# Patient Record
Sex: Female | Born: 1995 | Race: Black or African American | Hispanic: No | Marital: Single | Smoking: Never smoker
Health system: Southern US, Community
[De-identification: ages and names within clinical notes are randomized; demographics above are authoritative.]

---

## 2018-07-13 ENCOUNTER — Encounter (HOSPITAL_BASED_OUTPATIENT_CLINIC_OR_DEPARTMENT_OTHER): Payer: Self-pay | Admitting: Obstetrics & Gynecology

## 2018-07-13 ENCOUNTER — Ambulatory Visit: Payer: Self-pay

## 2018-07-13 ENCOUNTER — Ambulatory Visit (INDEPENDENT_AMBULATORY_CARE_PROVIDER_SITE_OTHER): Payer: Medicaid Other | Admitting: Obstetrics & Gynecology

## 2018-07-13 VITALS — BP 113/74 | HR 92 | Ht 65.0 in | Wt 247.0 lb

## 2018-07-13 DIAGNOSIS — Z01419 Encounter for gynecological examination (general) (routine) without abnormal findings: Secondary | ICD-10-CM

## 2018-07-13 DIAGNOSIS — Z113 Encounter for screening for infections with a predominantly sexual mode of transmission: Secondary | ICD-10-CM

## 2018-07-13 DIAGNOSIS — Z7189 Other specified counseling: Secondary | ICD-10-CM

## 2018-07-13 DIAGNOSIS — Z23 Encounter for immunization: Secondary | ICD-10-CM

## 2018-07-13 DIAGNOSIS — N898 Other specified noninflammatory disorders of vagina: Secondary | ICD-10-CM

## 2018-07-13 DIAGNOSIS — Z124 Encounter for screening for malignant neoplasm of cervix: Secondary | ICD-10-CM

## 2018-07-13 DIAGNOSIS — Z7185 Encounter for immunization safety counseling: Secondary | ICD-10-CM

## 2018-07-13 LAB — HEMOLYSIS INDEX: Hemolysis Index: 7 (ref 0–18)

## 2018-07-13 LAB — SYPHILIS SCREEN IGG AND IGM: Syphilis Screen IgG and IgM: NONREACTIVE

## 2018-07-13 LAB — HIV AG/AB 4TH GENERATION: HIV Ag/Ab, 4th Generation: NONREACTIVE

## 2018-07-13 LAB — THIN PREP PAP W/IMAGE ANALYSIS ,REFLEX HPV

## 2018-07-13 LAB — HEPATITIS B SURFACE ANTIGEN W/ REFLEX TO CONFIRMATION: Hepatitis B Surface Antigen: NONREACTIVE

## 2018-07-13 LAB — HEPATITIS C ANTIBODY: Hepatitis C, AB: NONREACTIVE

## 2018-07-13 MED ORDER — HPV 9-VALENT RECOMB VACCINE IM SUSY
1.00 | PREFILLED_SYRINGE | Freq: Once | INTRAMUSCULAR | Status: AC
Start: 2018-07-13 — End: 2018-07-13
  Administered 2018-07-13: 12:00:00 0.5 mL via INTRAMUSCULAR

## 2018-07-13 NOTE — Progress Notes (Signed)
HPI:  Tara Carroll is a 22 y.o. female, G1P1001, Patient's last menstrual period was 06/13/2018 (approximate).. Patient (new) is here today for annual GYN visit.    Patient denies any abnormal pain, abnormal vaginal bleeding, itching/burning.  She reports: no other GYN complaints except vaginal discharge.     Gyn Hx:  Last PAP:wnl but unsure when per pt / Hx abnormal pap: No    Menses: 13 / 2-7 days / monthly  BCM(current): none  BCM(prior): none   Sexual Activity: Yes(with man) / Condom use: No / Abuse: No    Hx STDs: No    HPV Vaccine: Not completed (1st dose 07/13/18)  OB Hx: G1P1001 (C/S x1 for 2nd stage arrest)    PMH:  History reviewed. No pertinent past medical history.  PSH:  Past Surgical History:   Procedure Laterality Date   . CESAREAN SECTION  2017     SH:  Social History     Socioeconomic History   . Marital status: Unknown     Spouse name: Not on file   . Number of children: Not on file   . Years of education: Not on file   . Highest education level: Not on file   Occupational History   . Not on file   Social Needs   . Financial resource strain: Not on file   . Food insecurity:     Worry: Not on file     Inability: Not on file   . Transportation needs:     Medical: Not on file     Non-medical: Not on file   Tobacco Use   . Smoking status: Never Smoker   . Smokeless tobacco: Never Used   Substance and Sexual Activity   . Alcohol use: Yes   . Drug use: Never   . Sexual activity: Yes     Partners: Male     Birth control/protection: None   Lifestyle   . Physical activity:     Days per week: Not on file     Minutes per session: Not on file   . Stress: Not on file   Relationships   . Social connections:     Talks on phone: Not on file     Gets together: Not on file     Attends religious service: Not on file     Active member of club or organization: Not on file     Attends meetings of clubs or organizations: Not on file     Relationship status: Not on file   . Intimate partner violence:     Fear of current  or ex partner: Not on file     Emotionally abused: Not on file     Physically abused: Not on file     Forced sexual activity: Not on file   Other Topics Concern   . Not on file   Social History Narrative   . Not on file     FH:  Family History   Problem Relation Age of Onset   . Hypertension Maternal Grandmother    . Hypertension Mother    . Miscarriages / [place] Mother    . Diabetes Other    . Breast cancer Neg Hx    . Cancer Neg Hx    . Colon cancer Neg Hx    . Eclampsia Neg Hx    . Ovarian cancer Neg Hx    . Stroke Neg Hx    . Preterm labor Neg Hx  No Known Allergies  No current outpatient medications on file.     No current facility-administered medications for this visit.      Review of Systems:  General ROS: negative for fatigue, fever/chills, weight loss  Breast ROS: negative for breast lumps, nipple discharge  Gastrointestinal ROS: no abdominal pain, change in bowel habits  Genitourinary ROS: no dysuria, trouble voiding, or hematuria  Skin: denies rash or lesion  Psych: denies depression/anxiety  All other systems as per HPI, otherwise reviewed and negative     Objective:  BP 113/74   Pulse 92   Ht 5\' 5"  (1.651 m)   Wt 247 lb (112 kg)   LMP 06/13/2018 (Approximate)   BMI 41.10 kg/m     General appearance: alert & oriented x3, well appearing, NAD  Breasts: breasts appear normal, symmetrical, no suspicious masses, no skin or nipple changes or axillary nodes, no nipple discharge  Abdomen - soft, nontender, nondistended, no masses  Extremities: symmetric, no signs of clubbing or edema     Pelvic exam:  VULVA: normal appearing vulva with no masses, tenderness or lesions, normal clitoris,  VAGINA: normal appearing vagina with normal color/secretions, no abnormal discharge, no lesions,  CERVIX: normal appearing cervix without discharge or lesions,  UTERUS: uterus is normal size, shape, consistency and non-tender  ADNEXA:  Non-tender and no masses.    Assessment: 22 y.o. G18P1001 female.  1. Well woman  exam with routine gynecological exam    2. Screening for STDs (sexually transmitted diseases)    3. Encounter for screening for cervical cancer     4. Vaginal discharge      Plan:  - Well woman exam completed: PAP sent today b/c pt is NEW w/o records.  - Self breast exam reviewed, self-breast awareness encouraged  - Contraception counseling completed: LARC recommended  - STI testing offered, ordered: Yes pt desires. Safe sexual practices reviewed, condom use recommended.  - HPV vaccine discussed and recommended: 1st shot given today  - Recommend healthy diet, activity/excercise and habits.  - All questions answered and POC/AVS reviewed with the patient.    - RTO: in 1 year for annual well woman GYN/breast exam and PRN.    - GYN issue(s): vaginal discharge - r/o vaginitis    Orders Placed This Encounter   Procedures   . SureSwab(TM) Plus   . HIV Ag/Ab 4th generation   . Hepatitis C (HCV) antibody, Total   . Syphilis Screen IgG and IgM   . Hepatitis B surface antigen     Idelle Leech MD, Evern Core

## 2018-07-13 NOTE — Patient Instructions (Signed)
You had a normal well woman exam including a normal breast exam today.   Please continue to do your monthly self breast exam.  Continue to eat a diet with many fruits, vegetables and lean proteins.   It is best to aim for at least 30-60 minutes of exercise daily.   Please consume a diet rich in calcium and vitamin D. The recommended intake for women ages 18-50 for calcium is at least 1000 mg daily and for vitamin D is 600 international units daily.   Please use condoms with every intercourse to prevent sexually transmitted infections.   You should follow up in 1 year for a well woman exam.     Screening:   Pap testing starts at age 21 and is recommended every 3 years until age 30. After age 30 pap testing is recommended every 5 years.   Mammogram screening should start at age 40 sooner if you have a strong family history of breast cancer.

## 2018-07-13 NOTE — Progress Notes (Signed)
Medication Name: Gardasil 9  Site: LD  After obtaining consent and orders per Dr. Mayford Knife, injection of Gardasil 9 given by Audery Amel, LPN. Patient states there are No Known Allergies. Patient instructed to remain in clinic for 15 minutes afterwards, and report any adverse reaction to me immediately. Patient left in good condition.  Completed by: KDB  Verified by Audery Amel, LPN

## 2018-07-16 LAB — SURESWAB(TM) PLUS
Atopobium vaginae: NOT DETECTED
Candida Glabrata,DNA: NOT DETECTED
Candida Parapsilosis,DNA: NOT DETECTED
Candida Tropicalis,DNA: NOT DETECTED
Candida albicans DNA: NOT DETECTED
Chlamydia trachomatis RNA, TMA: NOT DETECTED
Gardnerella vaginalis: NOT DETECTED
Lactobacillus species: >8
Megasphaera species: NOT DETECTED
N. gonorrhoeae RNA, TMA: NOT DETECTED
Trichomonas Vaginalis RNA, QL TMA: NOT DETECTED

## 2018-07-18 ENCOUNTER — Encounter (HOSPITAL_BASED_OUTPATIENT_CLINIC_OR_DEPARTMENT_OTHER): Payer: Self-pay | Admitting: Obstetrics & Gynecology

## 2018-09-13 ENCOUNTER — Ambulatory Visit (HOSPITAL_BASED_OUTPATIENT_CLINIC_OR_DEPARTMENT_OTHER): Payer: Medicaid Other

## 2019-11-21 ENCOUNTER — Ambulatory Visit (HOSPITAL_BASED_OUTPATIENT_CLINIC_OR_DEPARTMENT_OTHER): Payer: Medicaid Other | Admitting: Nurse Practitioner

## 2020-01-25 ENCOUNTER — Ambulatory Visit (INDEPENDENT_AMBULATORY_CARE_PROVIDER_SITE_OTHER): Payer: Medicaid Other | Admitting: Obstetrics & Gynecology

## 2020-01-25 ENCOUNTER — Encounter (HOSPITAL_BASED_OUTPATIENT_CLINIC_OR_DEPARTMENT_OTHER): Payer: Self-pay | Admitting: Obstetrics & Gynecology

## 2020-01-25 VITALS — BP 119/79 | HR 79 | Temp 98.7°F | Ht 65.0 in | Wt 271.8 lb

## 2020-01-25 DIAGNOSIS — N926 Irregular menstruation, unspecified: Secondary | ICD-10-CM

## 2020-02-29 ENCOUNTER — Encounter (HOSPITAL_BASED_OUTPATIENT_CLINIC_OR_DEPARTMENT_OTHER): Payer: Self-pay | Admitting: Obstetrics & Gynecology

## 2020-02-29 NOTE — Progress Notes (Signed)
HPI:   Tara Carroll is a 24 y.o. female, G1P1001, Patient's last menstrual period was 01/14/2020 (exact date).. Established patient presents today for GYN problem: irregular menses. Patient reports: absence of menses x2 months and thinking she was pregnant but home UPT neg. Since then she has noticed an irregularity to timing of menses.     Menstrual Diary (past 5 months):  Jan: 16-20  Feb: missed  March: missed  April: 1-5  May: 1-3    Brief GYN Hx:  Last PAP:06/2018 - negative  Hx abnormal pap: No   Menses: 13 / 3-5 days / monthly   BCM(current): none   BCM(prior): none   Sexual Activity: Yes(with man) / Condom use: No / Abuse: No   Hx STDs: No   HPV Vaccine: Not completed (1st dose 07/13/18)   OB Hx: G1P1001 (C/S x1 for 2nd stage arrest)    No Known Allergies  No current outpatient medications on file.    Review of Systems:  All other systems reviewed as in HPI or o/w negative    Objective:  BP 119/79 (BP Site: Left arm, Patient Position: Sitting, Cuff Size: Large)    Pulse 79    Temp 98.7 F (37.1 C)    Ht 5\' 5"  (1.651 m)    Wt 271 lb 12.8 oz (123.3 kg)    LMP 01/14/2020 (Exact Date)    BMI 45.23 kg/m     General appearance - A&O x3, well-appearing, NAD    Assessment:  1. Irregular menses       Plan:  - reviewed normal ranges for menstrual cycles and variations that can occur. Typically most common cause for skipped menses is stress-related once pregnancy ruled out. Her most recent pattern since the skipped months is WNL. Reassurance given.   - Advised menstrual diary and to notify this MD for any AUB, prolonged/heavy VB or impending amenorrhea >4 months w/o menses  - no imaging recommended  - can regulate menses w/ hormonal BCM (OCPs, patch, NuvaRing); declined  - Follow-up: prn and annual GYN  - All questions answered and POC/AVS reviewed with the patient.    Idelle Leech MD, [facility]

## 2020-03-29 ENCOUNTER — Encounter (HOSPITAL_BASED_OUTPATIENT_CLINIC_OR_DEPARTMENT_OTHER): Payer: Self-pay | Admitting: Emergency Medicine

## 2020-03-29 ENCOUNTER — Other Ambulatory Visit: Payer: Self-pay

## 2020-03-29 DIAGNOSIS — O200 Threatened abortion: Secondary | ICD-10-CM | POA: Insufficient documentation

## 2020-03-29 DIAGNOSIS — R1030 Lower abdominal pain, unspecified: Secondary | ICD-10-CM | POA: Diagnosis present

## 2020-03-29 NOTE — ED Triage Notes (Signed)
Lower abdominal pain and spotting x3 weeks.

## 2020-03-30 ENCOUNTER — Other Ambulatory Visit (HOSPITAL_BASED_OUTPATIENT_CLINIC_OR_DEPARTMENT_OTHER): Payer: Self-pay | Admitting: Emergency Medicine

## 2020-03-30 ENCOUNTER — Encounter (HOSPITAL_BASED_OUTPATIENT_CLINIC_OR_DEPARTMENT_OTHER): Payer: Self-pay | Admitting: Emergency Medicine

## 2020-03-30 ENCOUNTER — Ambulatory Visit (HOSPITAL_BASED_OUTPATIENT_CLINIC_OR_DEPARTMENT_OTHER)
Admission: RE | Admit: 2020-03-30 | Discharge: 2020-03-30 | Disposition: A | Discharge status: Home / Self Care | Payer: Medicaid Other | Source: Ambulatory Visit | Attending: Emergency Medicine | Admitting: Emergency Medicine

## 2020-03-30 ENCOUNTER — Emergency Department (HOSPITAL_BASED_OUTPATIENT_CLINIC_OR_DEPARTMENT_OTHER)
Admission: EM | Admit: 2020-03-30 | Discharge: 2020-03-30 | Disposition: A | Discharge status: Home / Self Care | Payer: Medicaid Other | Attending: Emergency Medicine | Admitting: Emergency Medicine

## 2020-03-30 DIAGNOSIS — R102 Pelvic and perineal pain: Secondary | ICD-10-CM | POA: Diagnosis present

## 2020-03-30 DIAGNOSIS — O200 Threatened abortion: Secondary | ICD-10-CM

## 2020-03-30 LAB — URINALYSIS, MICROSCOPIC (REFLEX)

## 2020-03-30 LAB — URINALYSIS, ROUTINE W REFLEX MICROSCOPIC
Bilirubin Urine: NEGATIVE
Glucose, UA: NEGATIVE mg/dL
Ketones, ur: NEGATIVE mg/dL
Leukocytes,Ua: NEGATIVE
Nitrite: NEGATIVE
Protein, ur: NEGATIVE mg/dL
Specific Gravity, Urine: >1.03 — ABNORMAL HIGH (ref 1.005–1.030)
pH: 6 (ref 5.0–8.0)

## 2020-03-30 LAB — ABO/RH: ABO/RH(D): O POS

## 2020-03-30 LAB — WET PREP, GENITAL
Clue Cells Wet Prep HPF POC: NONE SEEN
Sperm: NONE SEEN
Trich, Wet Prep: NONE SEEN
Yeast Wet Prep HPF POC: NONE SEEN

## 2020-03-30 LAB — HCG, QUANTITATIVE, PREGNANCY: hCG, Beta Chain, Quant, S: 207 m[IU]/mL — ABNORMAL HIGH (ref <5)

## 2020-03-30 LAB — PREGNANCY, URINE: Preg Test, Ur: POSITIVE — AB

## 2020-03-30 NOTE — ED Provider Notes (Signed)
[facility] Provider Note: Lowella Dell, MD, FACEP  CSN: [id number] MRN: [id number] ARRIVAL: 03/29/20 at 2332 ROOM: [facility]11/[facility]11   CHIEF COMPLAINT  Abdominal Pain   HISTORY OF PRESENT ILLNESS  03/30/20 2:32 AM Margaret Erickson is a 24 y.o. female who has had lower abdominal pain for the past 3 weeks.  She has had vaginal spotting with this as well.  Yesterday evening she developed severe lower abdominal pain which she rated as a 10 out of 10.  While taking a shower she passed what appeared to be a lump of clot or tissue.  She had never passed anything like that in her experience.  Her pain is subsequently significantly improved.  She denies nausea or vomiting currently.  She was not aware of being pregnant but her pregnancy test is positive.   History reviewed. No pertinent past medical history.  Past Surgical History:  Procedure Laterality Date  . CESAREAN SECTION      No family history on file.  Social History   Tobacco Use  . Smoking status: Never Smoker  . Smokeless tobacco: Never Used  Substance Use Topics  . Alcohol use: Not Currently  . Drug use: Not Currently    Prior to Admission medications   Not on File    Allergies Patient has no known allergies.   REVIEW OF SYSTEMS  Negative except as noted here or in the History of Present Illness.   PHYSICAL EXAMINATION  Initial Vital Signs Blood pressure 120/65, pulse 88, temperature 98.4 F (36.9 C), temperature source Oral, resp. rate 14, weight 120.1 kg, last menstrual period 03/08/2020, SpO2 100 %.  Examination General: Well-developed, well-nourished female in no acute distress; appearance consistent with age of record HENT: normocephalic; atraumatic Eyes: pupils equal, round and reactive to light; extraocular muscles intact Neck: supple Heart: regular rate and rhythm Lungs: clear to auscultation bilaterally Abdomen: soft; nondistended; nontender; bowel sounds present GU: Normal external  genitalia; cervical motion tenderness; whitish/mucoid vaginal discharge; no vaginal bleeding; cervical motion tenderness; no adnexal tenderness Extremities: No deformity; full range of motion; pulses normal Neurologic: Awake, alert and oriented; motor function intact in all extremities and symmetric; no facial droop Skin: Warm and dry Psychiatric: Normal mood and affect   RESULTS  Summary of this visit's results, reviewed and interpreted by myself:   EKG Interpretation  Date/Time:    Ventricular Rate:    PR Interval:    QRS Duration:   QT Interval:    QTC Calculation:   R Axis:     Text Interpretation:        Laboratory Studies: Results for orders placed or performed during the hospital encounter of 03/30/20 (from the past 24 hour(s))  Pregnancy, urine     Status: Abnormal   Collection Time: 03/29/20 11:45 PM  Result Value Ref Range   Preg Test, Ur POSITIVE (A) NEGATIVE  Urinalysis, Routine w reflex microscopic     Status: Abnormal   Collection Time: 03/29/20 11:45 PM  Result Value Ref Range   Color, Urine YELLOW YELLOW   APPearance HAZY (A) CLEAR   Specific Gravity, Urine >1.030 (H) 1.005 - 1.030   pH 6.0 5.0 - 8.0   Glucose, UA NEGATIVE NEGATIVE mg/dL   Hgb urine dipstick TRACE (A) NEGATIVE   Bilirubin Urine NEGATIVE NEGATIVE   Ketones, ur NEGATIVE NEGATIVE mg/dL   Protein, ur NEGATIVE NEGATIVE mg/dL   Nitrite NEGATIVE NEGATIVE   Leukocytes,Ua NEGATIVE NEGATIVE  Urinalysis, Microscopic (reflex)     Status: Abnormal  Collection Time: 03/29/20 11:45 PM  Result Value Ref Range   RBC / HPF 0-5 0 - 5 RBC/hpf   WBC, UA 0-5 0 - 5 WBC/hpf   Bacteria, UA MANY (A) NONE SEEN   Squamous Epithelial / LPF 6-10 0 - 5   Mucus PRESENT   Wet prep, genital     Status: Abnormal   Collection Time: 03/30/20  2:44 AM  Result Value Ref Range   Yeast Wet Prep HPF POC NONE SEEN NONE SEEN   Trich, Wet Prep NONE SEEN NONE SEEN   Clue Cells Wet Prep HPF POC NONE SEEN NONE SEEN   WBC,  Wet Prep HPF POC MANY (A) NONE SEEN   Sperm NONE SEEN    Imaging Studies: No results found.  ED COURSE and MDM  Nursing notes, initial and subsequent vitals signs, including pulse oximetry, reviewed and interpreted by myself.  Vitals:   03/29/20 2338 03/29/20 2343 03/30/20 0222  BP:  134/75 120/65  Pulse:  95 88  Resp:  18 14  Temp:  99.1 F (37.3 C) 98.4 F (36.9 C)  TempSrc:   Oral  SpO2:  100% 100%  Weight: 120.1 kg     Medications - No data to display  I suspect the patient has miscarried given that she has passed what appeared to be tissue and her pain has subsequently improved.  We will have her return later today for a transvaginal ultrasound.  PROCEDURES  Procedures   ED DIAGNOSES     ICD-10-CM   1. Threatened miscarriage in early pregnancy  O20.0        Trishelle Devora, MD 03/30/20 [phone]

## 2020-03-30 NOTE — ED Provider Notes (Signed)
Patient returned for [place] follow up and results.  [place] was obtained.   US OB LESS THAN 14 WEEKS WITH OB TRANSVAGINAL  Result Date: 03/30/2020 CLINICAL DATA:  Pelvic pain and vaginal bleeding. Positive pregnancy test. EXAM: OBSTETRIC <14 WK [place] AND TRANSVAGINAL OB US TECHNIQUE: Both transabdominal and transvaginal ultrasound examinations were performed for complete evaluation of the gestation as well as the maternal uterus, adnexal regions, and pelvic cul-de-sac. Transvaginal technique was performed to assess early pregnancy. COMPARISON:  None. FINDINGS: Intrauterine gestational sac: None Maternal uterus/adnexae: Endometrial thickness measures 11 mm. No fibroids identified. Both ovaries are normal in appearance. No adnexal mass or abnormal free fluid identified. IMPRESSION: Pregnancy of unknown anatomic location (no intrauterine gestational sac or adnexal mass identified). Differential diagnosis includes recent spontaneous abortion, IUP too early to visualize, and non-visualized ectopic pregnancy. Recommend correlation with serial beta-hCG levels, and follow up US if warranted clinically. Electronically Signed   By: Danae Orleans M.D.   On: 03/30/2020 11:58   Labs show her HCG was elevated at 207.   Patient reports overall she is doing better.  We discussed her results including possibility of pregnancy of unknown anatomic location, versus IUP too early to visualize versus miscarriage.  Recommended close outpatient follow-up for repeat hCG testing in approximately 48 hours.  She is given the information for [facility] outpatient clinic, instructed to call today to try and set up an appointment for Monday.  Definitely she is given the information for [facility], with instructions to proceed there if she has any new or concerning or worsening symptoms.  She states her understanding, denies any additional needs or questions at this time.  She ambulated in no distress.    Cristina Gong, PA-C 03/30/20 1231    Sabas Sous, MD 03/30/20 1539

## 2020-04-02 LAB — GC/CHLAMYDIA PROBE AMP (~~LOC~~) NOT AT ARMC
Chlamydia: NEGATIVE
Comment: NEGATIVE
Comment: NORMAL
Neisseria Gonorrhea: NEGATIVE

## 2020-04-03 ENCOUNTER — Emergency Department (HOSPITAL_BASED_OUTPATIENT_CLINIC_OR_DEPARTMENT_OTHER): Payer: Medicaid Other

## 2020-04-03 ENCOUNTER — Encounter (HOSPITAL_BASED_OUTPATIENT_CLINIC_OR_DEPARTMENT_OTHER): Payer: Self-pay | Admitting: *Deleted

## 2020-04-03 ENCOUNTER — Emergency Department (HOSPITAL_BASED_OUTPATIENT_CLINIC_OR_DEPARTMENT_OTHER)
Admission: EM | Admit: 2020-04-03 | Discharge: 2020-04-03 | Discharge status: Against Medical Advice | Payer: Medicaid Other | Attending: Emergency Medicine | Admitting: Emergency Medicine

## 2020-04-03 ENCOUNTER — Other Ambulatory Visit: Payer: Self-pay

## 2020-04-03 DIAGNOSIS — O219 Vomiting of pregnancy, unspecified: Secondary | ICD-10-CM | POA: Diagnosis not present

## 2020-04-03 DIAGNOSIS — R102 Pelvic and perineal pain: Secondary | ICD-10-CM | POA: Insufficient documentation

## 2020-04-03 DIAGNOSIS — O26899 Other specified pregnancy related conditions, unspecified trimester: Secondary | ICD-10-CM | POA: Insufficient documentation

## 2020-04-03 DIAGNOSIS — Z3A00 Weeks of gestation of pregnancy not specified: Secondary | ICD-10-CM | POA: Diagnosis not present

## 2020-04-03 DIAGNOSIS — N939 Abnormal uterine and vaginal bleeding, unspecified: Secondary | ICD-10-CM

## 2020-04-03 DIAGNOSIS — Z3490 Encounter for supervision of normal pregnancy, unspecified, unspecified trimester: Secondary | ICD-10-CM

## 2020-04-03 LAB — HCG, QUANTITATIVE, PREGNANCY: hCG, Beta Chain, Quant, S: 500 m[IU]/mL — ABNORMAL HIGH (ref <5)

## 2020-04-03 NOTE — ED Notes (Signed)
Pt sts she has to leave to pick up her son

## 2020-04-03 NOTE — ED Provider Notes (Cosign Needed)
[facility] EMERGENCY DEPARTMENT Provider Note   CSN: [id number] Arrival date & time: 04/03/20  1523     History Chief Complaint  Patient presents with  . Follow-up    Margaret Erickson is a 24 y.o. female.  HPI   Patient is a 24 year old female who presents to the emergency department today for evaluation of vaginal spotting.  States she has been spotting for the last week and has had some lower abdominal cramping with some intermittent sharp pains as well.  She was seen in the ED several days ago and had a positive pregnancy test.  At that time had an ultrasound which showed a pregnancy of undetermined location.  She was advised to follow-up in 48 hours for hCG recheck.  She states she did not follow-up with an OB/GYN because she did not want to start following up with an OB/GYN if she was having a miscarriage.  She has continued to have light spotting since then and persistent intermittent lower abdominal pain.  Denies any fevers.  Did have some vomiting a few days ago which is since improved.  Denies any diarrhea or other systemic symptoms.  States she has not had intercourse since she was seen in the ED last.  Reviewed records, ultrasound not confirm IUP.  GC/chlamydia negative.  UA negative for UTI.   Rh+.  History reviewed. No pertinent past medical history.  There are no problems to display for this patient.   Past Surgical History:  Procedure Laterality Date  . CESAREAN SECTION       OB History    Gravida  1   Para      Term      Preterm      AB      Living        SAB      TAB      Ectopic      Multiple      Live Births              No family history on file.  Social History   Tobacco Use  . Smoking status: Never Smoker  . Smokeless tobacco: Never Used  Substance Use Topics  . Alcohol use: Not Currently  . Drug use: Not Currently    Home Medications Prior to Admission medications   Not on File    Allergies    Patient has  no known allergies.  Review of Systems   Review of Systems  Constitutional: Negative for fever.  HENT: Negative for ear pain and sore throat.   Eyes: Negative for visual disturbance.  Respiratory: Negative for shortness of breath.   Cardiovascular: Negative for chest pain.  Gastrointestinal: Positive for vomiting (resolved). Negative for abdominal pain, constipation, diarrhea and nausea.  Genitourinary: Positive for pelvic pain and vaginal bleeding. Negative for dysuria and hematuria.  Musculoskeletal: Negative for back pain.  Skin: Negative for rash.  Neurological: Negative for light-headedness.  All other systems reviewed and are negative.   Physical Exam Updated Vital Signs BP 130/73   Pulse 99   Temp 99.3 F (37.4 C) (Rectal)   Resp 16   Ht 5\' 5"  (1.651 m)   Wt 121.1 kg   LMP 03/08/2020 (Approximate) Comment: unknown due date at this time - new pregnancy diagnosis today  SpO2 100%   BMI 44.43 kg/m   Physical Exam Vitals and nursing note reviewed.  Constitutional:      General: She is not in acute distress.  Appearance: She is well-developed.  HENT:     Head: Normocephalic and atraumatic.  Eyes:     Conjunctiva/sclera: Conjunctivae normal.  Cardiovascular:     Rate and Rhythm: Normal rate and regular rhythm.     Heart sounds: No murmur heard.   Pulmonary:     Effort: Pulmonary effort is normal. No respiratory distress.     Breath sounds: Normal breath sounds.  Abdominal:     General: Bowel sounds are normal.     Palpations: Abdomen is soft.     Tenderness: There is abdominal tenderness (mild lower abd discomfort). There is no guarding or rebound.  Musculoskeletal:     Cervical back: Neck supple.  Skin:    General: Skin is warm and dry.  Neurological:     Mental Status: She is alert.     ED Results / Procedures / Treatments   Labs (all labs ordered are listed, but only abnormal results are displayed) Labs Reviewed  HCG, QUANTITATIVE, PREGNANCY -  Abnormal; Notable for the following components:      Result Value   hCG, Beta Chain, Quant, S 500 (*)    All other components within normal limits    EKG None  Radiology US OB Transvaginal  Result Date: 04/03/2020 CLINICAL DATA:  Pelvic pain with bleeding EXAM: TRANSVAGINAL OB ULTRASOUND TECHNIQUE: Transvaginal ultrasound was performed for complete evaluation of the gestation as well as the maternal uterus, adnexal regions, and pelvic cul-de-sac. COMPARISON:  03/30/2020 FINDINGS: Intrauterine gestational sac: None Yolk sac:  Not Visualized. Embryo:  Not Visualized. Maternal uterus/adnexae: Ovaries are within normal limits. The left ovary measures 4.5 x 2 x 3.2 cm. The right ovary measures 3 x 2 x 3.2 cm. There is trace free fluid in the pelvis. IMPRESSION: No IUP identified. Findings remain consistent with pregnancy of unknown location, differential of which includes IUP too early to visualize, recent failed pregnancy, and occult ectopic. Trending of HCG with potential repeat ultrasound is recommended. Electronically Signed   By: Jasmine Pang M.D.   On: 04/03/2020 18:13    Procedures Procedures (including critical care time)  Medications Ordered in ED Medications - No data to display  ED Course  I have reviewed the triage vital signs and the nursing notes.  Pertinent labs & imaging results that were available during my care of the patient were reviewed by me and considered in my medical decision making (see chart for details).    MDM Rules/Calculators/A&P                          24 year old female presenting for evaluation of intermittent lower abdominal pain and spotting.  Seen 4 days ago and noted to be pregnant.  Had ultrasound that was unable to confirm IUP.  Recommended for serial beta quant's.  Here today with persistent symptoms.  Beta quant obtained and is slightly increased from prior but not increasing as would be expected within normal pregnancy.  Ultrasound still does not  reveal an IUP.  Concern for threatened miscarriage versus ectopic.  Advised that she will need to follow-up in another 48 hours to have her beta quant rechecked.  Advised on importance of doing so.  Vies on specific return precautions.  She voices understanding of the plan and reasons to return for all questions answered.  Patient stable for discharge.  Pt eloped prior to receiving her discharge paperwork   Final Clinical Impression(s) / ED Diagnoses Final diagnoses:  Pelvic pain  Rx / DC Orders ED Discharge Orders    None       Karrie Meres, [place] 04/03/20 1841

## 2020-04-03 NOTE — ED Triage Notes (Signed)
States she here for follow up from last week , preg and " spotting" pt did not follow up with OB

## 2020-04-10 ENCOUNTER — Encounter (HOSPITAL_BASED_OUTPATIENT_CLINIC_OR_DEPARTMENT_OTHER): Payer: Self-pay | Admitting: Emergency Medicine

## 2020-04-10 ENCOUNTER — Emergency Department (HOSPITAL_BASED_OUTPATIENT_CLINIC_OR_DEPARTMENT_OTHER)
Admission: EM | Admit: 2020-04-10 | Discharge: 2020-04-11 | Disposition: A | Discharge status: Home / Self Care | Payer: Medicaid Other | Attending: Emergency Medicine | Admitting: Emergency Medicine

## 2020-04-10 DIAGNOSIS — Z3A00 Weeks of gestation of pregnancy not specified: Secondary | ICD-10-CM | POA: Insufficient documentation

## 2020-04-10 DIAGNOSIS — O4690 Antepartum hemorrhage, unspecified, unspecified trimester: Secondary | ICD-10-CM | POA: Diagnosis not present

## 2020-04-10 DIAGNOSIS — N939 Abnormal uterine and vaginal bleeding, unspecified: Secondary | ICD-10-CM | POA: Diagnosis not present

## 2020-04-10 LAB — CBC WITH DIFFERENTIAL/PLATELET
Abs Immature Granulocytes: 0.03 10*3/uL (ref 0.00–0.07)
Basophils Absolute: 0 10*3/uL (ref 0.0–0.1)
Basophils Relative: 0 %
Eosinophils Absolute: 0 10*3/uL (ref 0.0–0.5)
Eosinophils Relative: 0 %
HCT: 36.1 % (ref 36.0–46.0)
Hemoglobin: 11.7 g/dL — ABNORMAL LOW (ref 12.0–15.0)
Immature Granulocytes: 0 %
Lymphocytes Relative: 32 %
Lymphs Abs: 2.2 10*3/uL (ref 0.7–4.0)
MCH: 28.7 pg (ref 26.0–34.0)
MCHC: 32.4 g/dL (ref 30.0–36.0)
MCV: 88.5 fL (ref 80.0–100.0)
Monocytes Absolute: 0.6 10*3/uL (ref 0.1–1.0)
Monocytes Relative: 9 %
Neutro Abs: 4.1 10*3/uL (ref 1.7–7.7)
Neutrophils Relative %: 59 %
Platelets: 212 10*3/uL (ref 150–400)
RBC: 4.08 MIL/uL (ref 3.87–5.11)
RDW: 13.1 % (ref 11.5–15.5)
WBC: 7 10*3/uL (ref 4.0–10.5)
nRBC: 0 % (ref 0.0–0.2)

## 2020-04-10 LAB — PREGNANCY, URINE: Preg Test, Ur: POSITIVE — AB

## 2020-04-10 LAB — HCG, QUANTITATIVE, PREGNANCY: hCG, Beta Chain, Quant, S: 2586 m[IU]/mL — ABNORMAL HIGH (ref <5)

## 2020-04-10 NOTE — Discharge Instructions (Signed)
Return to this emergency room tomorrow for a early OB ultrasound. Go directly to [facility] for pain or heavy bleeding for evaluation in the women's unit.

## 2020-04-10 NOTE — ED Provider Notes (Signed)
[facility] EMERGENCY DEPARTMENT Provider Note   CSN: [id number] Arrival date & time: 04/10/20  1816     History Chief Complaint  Patient presents with  . Vaginal Bleeding    Margaret Erickson is a 24 y.o. female.  24 year old female G2P1 (c-section with first delivery 4 years ago). Seen in the ED 7/16 with spotting, diagnosed with threatened miscarriage, [place] without IUP visualized and thought to be too early to idenitfy. LMP 2 months ago, hcg at that visit 207. Patient returned on 7/20 with ongoing cramping and spotting, hcg at that time 500 and told to see OB. Has not followed up with OB. Presents today with worsening cramping and passing dime sized clots, not needing a pad. Review of prior record, O+, will not need rhogam.  LMP 6/1-6/5        History reviewed. No pertinent past medical history.  There are no problems to display for this patient.   History reviewed. No pertinent surgical history.   OB History    Gravida  1   Para      Term      Preterm      AB      Living        SAB      TAB      Ectopic      Multiple      Live Births              History reviewed. No pertinent family history.  Social History   Tobacco Use  . Smoking status: Never Smoker  . Smokeless tobacco: Never Used  Vaping Use  . Vaping Use: Never used  Substance Use Topics  . Alcohol use: Not Currently  . Drug use: Never    Home Medications Prior to Admission medications   Not on File    Allergies    Patient has no known allergies.  Review of Systems   Review of Systems  Constitutional: Negative for fever.  Respiratory: Negative for shortness of breath.   Gastrointestinal: Negative for abdominal pain, nausea and vomiting.  Genitourinary: Positive for vaginal bleeding. Negative for pelvic pain.  Skin: Negative for rash and wound.  Neurological: Negative for dizziness and weakness.  All other systems reviewed and are negative.   Physical  Exam Updated Vital Signs BP 123/73 (BP Location: Right Arm)   Pulse 86   Temp 99.4 F (37.4 C) (Oral)   Resp 14   Ht 5\' 5"  (1.651 m)   Wt (!) 121.6 kg   LMP 02/18/2020 (Exact Date)   SpO2 100%   BMI 44.60 kg/m   Physical Exam Vitals and nursing note reviewed. Exam conducted with a chaperone present.  Constitutional:      General: She is not in acute distress.    Appearance: She is well-developed. She is not diaphoretic.  HENT:     Head: Normocephalic and atraumatic.  Pulmonary:     Effort: Pulmonary effort is normal.  Abdominal:     Palpations: Abdomen is soft.     Tenderness: There is no abdominal tenderness.  Genitourinary:    Vagina: No tenderness.     Cervix: No cervical motion tenderness or friability.     Uterus: Enlarged. Not tender.      Adnexa:        Right: No mass, tenderness or fullness.         Left: No mass, tenderness or fullness.       Comments: Brown discharge  presents at os Skin:    General: Skin is warm and dry.     Findings: No erythema or rash.  Neurological:     Mental Status: She is alert and oriented to person, place, and time.  Psychiatric:        Behavior: Behavior normal.     ED Results / Procedures / Treatments   Labs (all labs ordered are listed, but only abnormal results are displayed) Labs Reviewed  PREGNANCY, URINE - Abnormal; Notable for the following components:      Result Value   Preg Test, Ur POSITIVE (*)    All other components within normal limits  HCG, QUANTITATIVE, PREGNANCY - Abnormal; Notable for the following components:   hCG, Beta Chain, Quant, S 2,586 (*)    All other components within normal limits  CBC WITH DIFFERENTIAL/PLATELET - Abnormal; Notable for the following components:   Hemoglobin 11.7 (*)    All other components within normal limits    EKG None  Radiology No results found.  Procedures Procedures (including critical care time)  Medications Ordered in ED Medications - No data to  display  ED Course  I have reviewed the triage vital signs and the nursing notes.  Pertinent labs & imaging results that were available during my care of the patient were reviewed by me and considered in my medical decision making (see chart for details).  Clinical Course as of Apr 10 2316  Tue Apr 10, 2020  6977 24 year old female resents with complaint of ongoing spotting in early pregnancy.  Patient with 2 prior visits to the ER, has a second MRN number, reviewed records in this other file, has had 2 ultrasounds which do not show IUP, prior quant's of 205 100 respectively.  ABO Rh is O+, does not need RhoGam today.  No pelvic pain on exam.  CBC with hemoglobin of 11.7.  hCG today of 2000 586.  Discussed results with Dr. Shawnie Pons, on-call with OB, if patient is not having pain on exam, can return in the morning for her ultrasound study which could not be done of this department tonight (staff not in the department for [place]).   [LM]  2315 Discussed results with patient, discussed possibility of ectopic pregnancy although not having pain at this time.  Ultrasound was scheduled for patient to return to this department in the morning for her ultrasound study, also given information for OB clinic in this building as she states this location is most convenient for her and why she is seeking her care here.  Advised patient that if she develops pain, heavy bleeding or other concerns, recommend reevaluation in the ER or at [facility]U at [facility].   [LM]    Clinical Course User Index [LM] Alden Hipp   MDM Rules/Calculators/A&P                          Final Clinical Impression(s) / ED Diagnoses Final diagnoses:  Vaginal bleeding in pregnancy    Rx / DC Orders ED Discharge Orders         Ordered    [place] OP OB Comp Less 14 Wks     Discontinue     04/10/20 2302           Jeannie Fend, PA-C 04/10/20 2317    Geoffery Lyons, MD 04/11/20 [phone]

## 2020-04-10 NOTE — Medical Student Note (Signed)
° °  [facility] Provider Student Note For educational purposes for Medical, PA and NP students only and not part of the legal medical record.   CSN: [id number] Arrival date & time: 04/10/20  1816      History   Chief Complaint Chief Complaint  Patient presents with   Vaginal Bleeding    HPI Margaret Erickson is a 24 y.o. female G2P1. This pregnancy was planned. Hx of irregular periods. No other medical conditions, doesn't take any medications other than prenatals.  LMP was 2 months ago. Presented to the ED 2 weeks ago with threatened miscarriage. Had some cramping and spotting. They tested HcG levels which were not where they were expected to be due to her dating. No bleeding the past 2 weeks and has been taking home upt daily that have been positve. Today notes more severe cramping and passing small clots -- dime sized. Isn't wearing a pad, not enough blood to soak through a pad.  Had some breast tenderness, no nausea or other signs of pregnancy.  Endorses some lightheadness.  No fever.   HPI  History reviewed. No pertinent past medical history.  There are no problems to display for this patient.   History reviewed. No pertinent surgical history.  OB History    Gravida  1   Para      Term      Preterm      AB      Living        SAB      TAB      Ectopic      Multiple      Live Births               Home Medications    Prior to Admission medications   Not on File    Family History History reviewed. No pertinent family history.  Social History Social History   Tobacco Use   Smoking status: Never Smoker   Smokeless tobacco: Never Used  Building services engineer Use: Never used  Substance Use Topics   Alcohol use: Not Currently   Drug use: Never     Allergies   Patient has no known allergies.   Review of Systems Review of Systems   Physical Exam Updated Vital Signs BP 117/73 (BP Location: Left Arm)    Pulse 95    Temp  99.7 F (37.6 C) (Oral)    Resp 20    Ht 5\' 5"  (1.651 m)    Wt (!) 121.6 kg    LMP 02/18/2020 (Exact Date)    SpO2 99%    BMI 44.60 kg/m   Physical Exam   ED Treatments / Results  Labs (all labs ordered are listed, but only abnormal results are displayed) Labs Reviewed  PREGNANCY, URINE    EKG  Radiology No results found.  Procedures Procedures (including critical care time)  Medications Ordered in ED Medications - No data to display   Initial Impression / Assessment and Plan / ED Course  I have reviewed the triage vital signs and the nursing notes.  Pertinent labs & imaging results that were available during my care of the patient were reviewed by me and considered in my medical decision making (see chart for details).   Final Clinical Impressions(s) / ED Diagnoses   Final diagnoses:  None    New Prescriptions New Prescriptions   No medications on file

## 2020-04-10 NOTE — ED Notes (Signed)
In to assess the Pt.    PA Student in at Pt. Bedside to assess the pt. At present time.  Pt. To be assessed by RN after the student PA is finished.

## 2020-04-10 NOTE — ED Triage Notes (Signed)
Pt c/o vaginal bleeding today  Possible miscarriage  % weeks pregnant   LMP 02/18/2020

## 2020-04-11 ENCOUNTER — Inpatient Hospital Stay (HOSPITAL_COMMUNITY): Payer: Medicaid Other

## 2020-04-11 ENCOUNTER — Inpatient Hospital Stay (EMERGENCY_DEPARTMENT_HOSPITAL)
Admission: AD | Admit: 2020-04-11 | Discharge: 2020-04-11 | Disposition: A | Discharge status: Home / Self Care | Payer: Medicaid Other | Source: Home / Self Care | Attending: Family Medicine | Admitting: Family Medicine

## 2020-04-11 ENCOUNTER — Encounter (HOSPITAL_COMMUNITY): Payer: Self-pay | Admitting: Family Medicine

## 2020-04-11 ENCOUNTER — Other Ambulatory Visit: Payer: Self-pay

## 2020-04-11 ENCOUNTER — Encounter: Payer: Self-pay | Admitting: Advanced Practice Midwife

## 2020-04-11 DIAGNOSIS — O200 Threatened abortion: Secondary | ICD-10-CM

## 2020-04-11 DIAGNOSIS — O26899 Other specified pregnancy related conditions, unspecified trimester: Secondary | ICD-10-CM

## 2020-04-11 DIAGNOSIS — Z3A01 Less than 8 weeks gestation of pregnancy: Secondary | ICD-10-CM

## 2020-04-11 DIAGNOSIS — O26891 Other specified pregnancy related conditions, first trimester: Secondary | ICD-10-CM | POA: Insufficient documentation

## 2020-04-11 DIAGNOSIS — O209 Hemorrhage in early pregnancy, unspecified: Secondary | ICD-10-CM

## 2020-04-11 DIAGNOSIS — O3680X0 Pregnancy with inconclusive fetal viability, not applicable or unspecified: Secondary | ICD-10-CM | POA: Insufficient documentation

## 2020-04-11 DIAGNOSIS — O26851 Spotting complicating pregnancy, first trimester: Secondary | ICD-10-CM | POA: Insufficient documentation

## 2020-04-11 DIAGNOSIS — R102 Pelvic and perineal pain: Secondary | ICD-10-CM | POA: Insufficient documentation

## 2020-04-11 MED ORDER — PROMETHAZINE HCL 25 MG PO TABS: 25.0000 mg | ORAL_TABLET | Freq: Four times a day (QID) | ORAL | 2 refills | Status: AC | PRN

## 2020-04-11 NOTE — MAU Note (Signed)
Pt here with worsening abdominal pain. Was seen at [facility] recently and told to come here for ultrasound. HCG being followed. Pt denies pain currently. Reports some vaginal spotting when wiping.

## 2020-04-11 NOTE — Discharge Instructions (Signed)
First Trimester of Pregnancy The first trimester of pregnancy is from week 1 until the end of week 13 (months 1 through 3). A week after a sperm fertilizes an egg, the egg will implant on the wall of the uterus. This embryo will begin to develop into a baby. Genes from you and your partner will form the baby. The female genes will determine whether the baby will be a boy or a girl. At 6-8 weeks, the eyes and face will be formed, and the heartbeat can be seen on ultrasound. At the end of 12 weeks, all the baby's organs will be formed. Now that you are pregnant, you will want to do everything you can to have a healthy baby. Two of the most important things are to get good prenatal care and to follow your health care provider's instructions. Prenatal care is all the medical care you receive before the baby's birth. This care will help prevent, find, and treat any problems during the pregnancy and childbirth. Body changes during your first trimester Your body goes through many changes during pregnancy. The changes vary from woman to woman.  You may gain or lose a couple of pounds at first.  You may feel sick to your stomach (nauseous) and you may throw up (vomit). If the vomiting is uncontrollable, call your health care provider.  You may tire easily.  You may develop headaches that can be relieved by medicines. All medicines should be approved by your health care provider.  You may urinate more often. Painful urination may mean you have a bladder infection.  You may develop heartburn as a result of your pregnancy.  You may develop constipation because certain hormones are causing the muscles that push stool through your intestines to slow down.  You may develop hemorrhoids or swollen veins (varicose veins).  Your breasts may begin to grow larger and become tender. Your nipples may stick out more, and the tissue that surrounds them (areola) may become darker.  Your gums may bleed and may be  sensitive to brushing and flossing.  Dark spots or blotches (chloasma, mask of pregnancy) may develop on your face. This will likely fade after the baby is born.  Your menstrual periods will stop.  You may have a loss of appetite.  You may develop cravings for certain kinds of food.  You may have changes in your emotions from day to day, such as being excited to be pregnant or being concerned that something may go wrong with the pregnancy and baby.  You may have more vivid and strange dreams.  You may have changes in your hair. These can include thickening of your hair, rapid growth, and changes in texture. Some women also have hair loss during or after pregnancy, or hair that feels dry or thin. Your hair will most likely return to normal after your baby is born. What to expect at prenatal visits During a routine prenatal visit:  You will be weighed to make sure you and the baby are growing normally.  Your blood pressure will be taken.  Your abdomen will be measured to track your baby's growth.  The fetal heartbeat will be listened to between weeks 10 and 14 of your pregnancy.  Test results from any previous visits will be discussed. Your health care provider may ask you:  How you are feeling.  If you are feeling the baby move.  If you have had any abnormal symptoms, such as leaking fluid, bleeding, severe headaches, or abdominal   cramping.  If you are using any tobacco products, including cigarettes, chewing tobacco, and electronic cigarettes.  If you have any questions. Other tests that may be performed during your first trimester include:  Blood tests to find your blood type and to check for the presence of any previous infections. The tests will also be used to check for low iron levels (anemia) and protein on red blood cells (Rh antibodies). Depending on your risk factors, or if you previously had diabetes during pregnancy, you may have tests to check for high blood sugar  that affects pregnant women (gestational diabetes).  Urine tests to check for infections, diabetes, or protein in the urine.  An ultrasound to confirm the proper growth and development of the baby.  Fetal screens for spinal cord problems (spina bifida) and Down syndrome.  HIV (human immunodeficiency virus) testing. Routine prenatal testing includes screening for HIV, unless you choose not to have this test.  You may need other tests to make sure you and the baby are doing well. Follow these instructions at home: Medicines  Follow your health care provider's instructions regarding medicine use. Specific medicines may be either safe or unsafe to take during pregnancy.  Take a prenatal vitamin that contains at least 600 micrograms (mcg) of folic acid.  If you develop constipation, try taking a stool softener if your health care provider approves. Eating and drinking   Eat a balanced diet that includes fresh fruits and vegetables, whole grains, good sources of protein such as meat, eggs, or tofu, and low-fat dairy. Your health care provider will help you determine the amount of weight gain that is right for you.  Avoid raw meat and uncooked cheese. These carry germs that can cause birth defects in the baby.  Eating four or five small meals rather than three large meals a day may help relieve nausea and vomiting. If you start to feel nauseous, eating a few soda crackers can be helpful. Drinking liquids between meals, instead of during meals, also seems to help ease nausea and vomiting.  Limit foods that are high in fat and processed sugars, such as fried and sweet foods.  To prevent constipation: ? Eat foods that are high in fiber, such as fresh fruits and vegetables, whole grains, and beans. ? Drink enough fluid to keep your urine clear or pale yellow. Activity  Exercise only as directed by your health care provider. Most women can continue their usual exercise routine during  pregnancy. Try to exercise for 30 minutes at least 5 days a week. Exercising will help you: ? Control your weight. ? Stay in shape. ? Be prepared for labor and delivery.  Experiencing pain or cramping in the lower abdomen or lower back is a good sign that you should stop exercising. Check with your health care provider before continuing with normal exercises.  Try to avoid standing for long periods of time. Move your legs often if you must stand in one place for a long time.  Avoid heavy lifting.  Wear low-heeled shoes and practice good posture.  You may continue to have sex unless your health care provider tells you not to. Relieving pain and discomfort  Wear a good support bra to relieve breast tenderness.  Take warm sitz baths to soothe any pain or discomfort caused by hemorrhoids. Use hemorrhoid cream if your health care provider approves.  Rest with your legs elevated if you have leg cramps or low back pain.  If you develop varicose veins in   your legs, wear support hose. Elevate your feet for 15 minutes, 3-4 times a day. Limit salt in your diet. Prenatal care  Schedule your prenatal visits by the twelfth week of pregnancy. They are usually scheduled monthly at first, then more often in the last 2 months before delivery.  Write down your questions. Take them to your prenatal visits.  Keep all your prenatal visits as told by your health care provider. This is important. Safety  Wear your seat belt at all times when driving.  Make a list of emergency phone numbers, including numbers for family, friends, the hospital, and police and fire departments. General instructions  Ask your health care provider for a referral to a local prenatal education class. Begin classes no later than the beginning of month 6 of your pregnancy.  Ask for help if you have counseling or nutritional needs during pregnancy. Your health care provider can offer advice or refer you to specialists for help  with various needs.  Do not use hot tubs, steam rooms, or saunas.  Do not douche or use tampons or scented sanitary pads.  Do not cross your legs for long periods of time.  Avoid cat litter boxes and soil used by cats. These carry germs that can cause birth defects in the baby and possibly loss of the fetus by miscarriage or stillbirth.  Avoid all smoking, herbs, alcohol, and medicines not prescribed by your health care provider. Chemicals in these products affect the formation and growth of the baby.  Do not use any products that contain nicotine or tobacco, such as cigarettes and e-cigarettes. If you need help quitting, ask your health care provider. You may receive counseling support and other resources to help you quit.  Schedule a dentist appointment. At home, brush your teeth with a soft toothbrush and be gentle when you floss. Contact a health care provider if:  You have dizziness.  You have mild pelvic cramps, pelvic pressure, or nagging pain in the abdominal area.  You have persistent nausea, vomiting, or diarrhea.  You have a bad smelling vaginal discharge.  You have pain when you urinate.  You notice increased swelling in your face, hands, legs, or ankles.  You are exposed to fifth disease or chickenpox.  You are exposed to German measles (rubella) and have never had it. Get help right away if:  You have a fever.  You are leaking fluid from your vagina.  You have spotting or bleeding from your vagina.  You have severe abdominal cramping or pain.  You have rapid weight gain or loss.  You vomit blood or material that looks like coffee grounds.  You develop a severe headache.  You have shortness of breath.  You have any kind of trauma, such as from a fall or a car accident. Summary  The first trimester of pregnancy is from week 1 until the end of week 13 (months 1 through 3).  Your body goes through many changes during pregnancy. The changes vary from  woman to woman.  You will have routine prenatal visits. During those visits, your health care provider will examine you, discuss any test results you may have, and talk with you about how you are feeling. This information is not intended to replace advice given to you by your health care provider. Make sure you discuss any questions you have with your health care provider. Document Revised: 08/14/2017 Document Reviewed: 08/13/2016 [employer] Patient Education  2020 [employer]  

## 2020-04-11 NOTE — MAU Provider Note (Signed)
Chief Complaint: No chief complaint on file.   First Provider Initiated Contact with Patient 04/11/20 0111        SUBJECTIVE HPI: Margaret Erickson is a 24 y.o. G1P0 at Unknown by LMP who presents to maternity admissions reporting pelvic cramping and spotting.  Was seen in Urgent Care on 03/30/20 then on 7/20 and againt tonight.  She was scheduled to go back to [facility] in am for [place] but came here instead. . She denies vaginal itching/burning, urinary symptoms, h/a, dizziness, n/v, or fever/chills.    HCGs         207   03/30/20                    500   04/03/20                    2586  04/10/20 HPI RN Note: Pt here with worsening abdominal pain. Was seen at [facility] recently and told to come here for ultrasound. HCG being followed. Pt denies pain currently. Reports some vaginal spotting when wiping.   No past medical history on file. Past Surgical History:  Procedure Laterality Date  . CESAREAN SECTION     Social History   Socioeconomic History  . Marital status: Single    Spouse name: Not on file  . Number of children: Not on file  . Years of education: Not on file  . Highest education level: Not on file  Occupational History  . Not on file  Tobacco Use  . Smoking status: Never Smoker  . Smokeless tobacco: Never Used  Substance and Sexual Activity  . Alcohol use: Not Currently  . Drug use: Not Currently  . Sexual activity: Not on file  Other Topics Concern  . Not on file  Social History Narrative  . Not on file   Social Determinants of Health   Financial Resource Strain:   . Difficulty of Paying Living Expenses:   Food Insecurity:   . Worried About Programme researcher, broadcasting/film/video in the Last Year:   . Barista in the Last Year:   Transportation Needs:   . Freight forwarder (Medical):   Marland Kitchen Lack of Transportation (Non-Medical):   Physical Activity:   . Days of Exercise per Week:   . Minutes of Exercise per Session:   Stress:   . Feeling of Stress :   Social Connections:   .  Frequency of Communication with Friends and Family:   . Frequency of Social Gatherings with Friends and Family:   . Attends Religious Services:   . Active Member of Clubs or Organizations:   . Attends Banker Meetings:   Marland Kitchen Marital Status:   Intimate Partner Violence:   . Fear of Current or Ex-Partner:   . Emotionally Abused:   Marland Kitchen Physically Abused:   . Sexually Abused:    No current facility-administered medications on file prior to encounter.   No current outpatient medications on file prior to encounter.   No Known Allergies  I have reviewed patient's Past Medical Hx, Surgical Hx, Family Hx, Social Hx, medications and allergies.   ROS:  Review of Systems  Constitutional: Negative for chills and fever.  Respiratory: Negative for shortness of breath.   Gastrointestinal: Positive for abdominal pain. Negative for constipation, diarrhea and nausea.  Genitourinary: Positive for pelvic pain and vaginal bleeding.  Neurological: Negative for dizziness.   Review of Systems  Other systems negative   Physical Exam  Physical Exam  Constitutional: Well-developed, well-nourished female in no acute distress.  Cardiovascular: normal rate Respiratory: normal effort GI: Abd soft, non-tender. Pos BS x 4 MS: Extremities nontender, no edema, normal ROM Neurologic: Alert and oriented x 4.  GU: Neg CVAT.  PELVIC EXAM: Done in ED  LAB RESULTS No results found for this or any previous visit (from the past 72 hour(s)). --/--/O POS (07/16 0246)  IMAGING US OB Transvaginal  Result Date: 04/11/2020 CLINICAL DATA:  Positive pregnancy test with spotting and pelvic pain EXAM: TRANSVAGINAL OB ULTRASOUND TECHNIQUE: Transvaginal ultrasound was performed for complete evaluation of the gestation as well as the maternal uterus, adnexal regions, and pelvic cul-de-sac. COMPARISON:  03/30/2020, 04/03/2020 FINDINGS: Intrauterine gestational sac: Present Yolk sac:  Present Embryo:  Absent MSD:  5.1 mm   5 w   2 d Subchorionic hemorrhage:  None visualized. Maternal uterus/adnexae: Ovaries are well visualized and within normal limits. IMPRESSION: Probable early intrauterine gestational sac, but no fetal pole, or cardiac activity yet visualized. Recommend follow-up quantitative B-HCG levels and follow-up US in 14 days to assess viability. This recommendation follows SRU consensus guidelines: Diagnostic Criteria for Nonviable Pregnancy Early in the First Trimester. Malva Limes Med 2013; 017:7939-03. Electronically Signed   By: Alcide Clever M.D.   On: 04/11/2020 01:51     MAU Management/MDM: Ordered followup Ultrasound to rule out ectopic. Reviewed findings of a yolk sac essentially rules out ectopic.  However we still don't know how the pregnancy will continue to progress.    This bleeding/pain can represent a normal pregnancy with bleeding, spontaneous abortion or even an ectopic which can be life-threatening.  The process as listed above helps to determine which of these is present.   ASSESSMENT Pregnancy at early gestation, [redacted]w[redacted]d by Gestational Sac Pelvic pain in pregnancy Spotting in pregnancy Threatened abortion  PLAN Discharge home Encouraged to seek prenatal care provider Followup in 2 wks with [place] as ordered by [facility] provider SAB precautions  Pt stable at time of discharge. Encouraged to return here or to other Urgent Care/ED if she develops worsening of symptoms, increase in pain, fever, or other concerning symptoms.    Wynelle Bourgeois CNM, MSN Certified Nurse-Midwife 04/11/2020  1:11 AM

## 2020-05-06 ENCOUNTER — Inpatient Hospital Stay (HOSPITAL_COMMUNITY)
Admission: AD | Admit: 2020-05-06 | Discharge: 2020-05-06 | Disposition: A | Discharge status: Home / Self Care | Payer: Medicaid Other | Attending: Obstetrics and Gynecology | Admitting: Obstetrics and Gynecology

## 2020-05-06 ENCOUNTER — Other Ambulatory Visit: Payer: Self-pay

## 2020-05-06 ENCOUNTER — Encounter (HOSPITAL_COMMUNITY): Payer: Self-pay | Admitting: Obstetrics and Gynecology

## 2020-05-06 ENCOUNTER — Inpatient Hospital Stay (HOSPITAL_COMMUNITY): Payer: Medicaid Other

## 2020-05-06 DIAGNOSIS — Z3A08 8 weeks gestation of pregnancy: Secondary | ICD-10-CM | POA: Diagnosis not present

## 2020-05-06 DIAGNOSIS — Z79899 Other long term (current) drug therapy: Secondary | ICD-10-CM | POA: Insufficient documentation

## 2020-05-06 DIAGNOSIS — O039 Complete or unspecified spontaneous abortion without complication: Secondary | ICD-10-CM | POA: Diagnosis present

## 2020-05-06 MED ORDER — MISOPROSTOL 200 MCG PO TABS: ORAL_TABLET | ORAL | 0 refills | Status: AC

## 2020-05-06 NOTE — MAU Provider Note (Signed)
Chief Complaint: Vaginal Bleeding   First Provider Initiated Contact with Patient 05/06/20 1550      SUBJECTIVE HPI: Margaret Erickson is a 24 y.o. G2P1001 [redacted]w[redacted]d by [place] (GS only) who presents to maternity admissions reporting increase in vaginal bleeding with dark red bleeding requiring pad. She is changing pads every 2-3 hours. They are not fully saturated. There is no pain. Bleeding increases when she stands up or walks and decreases when lying down.  There are some small clots with the bleeding.  She had hcg as follows:  HCGs  03/30/20  207            04/03/20   500            04/10/20   2586   [place] with GS and YS   HPI  No past medical history on file. Past Surgical History:  Procedure Laterality Date  . CESAREAN SECTION     Social History   Socioeconomic History  . Marital status: Single    Spouse name: Not on file  . Number of children: Not on file  . Years of education: Not on file  . Highest education level: Not on file  Occupational History  . Not on file  Tobacco Use  . Smoking status: Never Smoker  . Smokeless tobacco: Never Used  Vaping Use  . Vaping Use: Never used  Substance and Sexual Activity  . Alcohol use: Not Currently  . Drug use: Not Currently  . Sexual activity: Not on file  Other Topics Concern  . Not on file  Social History Narrative   ** Merged History Encounter **       Social Determinants of Health   Financial Resource Strain:   . Difficulty of Paying Living Expenses: Not on file  Food Insecurity:   . Worried About Programme researcher, broadcasting/film/video in the Last Year: Not on file  . Ran Out of Food in the Last Year: Not on file  Transportation Needs:   . Lack of Transportation (Medical): Not on file  . Lack of Transportation (Non-Medical): Not on file  Physical Activity:   . Days of Exercise per Week: Not on file  . Minutes of Exercise per Session: Not on file  Stress:   . Feeling of Stress : Not on file  Social Connections:   . Frequency of Communication  with Friends and Family: Not on file  . Frequency of Social Gatherings with Friends and Family: Not on file  . Attends Religious Services: Not on file  . Active Member of Clubs or Organizations: Not on file  . Attends Banker Meetings: Not on file  . Marital Status: Not on file  Intimate Partner Violence:   . Fear of Current or Ex-Partner: Not on file  . Emotionally Abused: Not on file  . Physically Abused: Not on file  . Sexually Abused: Not on file   No current facility-administered medications on file prior to encounter.   Current Outpatient Medications on File Prior to Encounter  Medication Sig Dispense Refill  . promethazine (PHENERGAN) 25 MG tablet Take 1 tablet (25 mg total) by mouth every 6 (six) hours as needed for nausea or vomiting. 30 tablet 2   No Known Allergies  ROS:  Review of Systems  Constitutional: Negative for chills, fatigue and fever.  Respiratory: Negative for shortness of breath.   Cardiovascular: Negative for chest pain.  Gastrointestinal: Negative for nausea and vomiting.  Genitourinary: Positive for vaginal bleeding. Negative for  difficulty urinating, dysuria, flank pain, pelvic pain, vaginal discharge and vaginal pain.  Neurological: Negative for dizziness and headaches.  Psychiatric/Behavioral: Negative.      I have reviewed patient's Past Medical Hx, Surgical Hx, Family Hx, Social Hx, medications and allergies.   Physical Exam   Patient Vitals for the past 24 hrs:  BP Temp Temp src Pulse Resp SpO2  05/06/20 1512 133/71 99.1 F (37.3 C) Oral 94 16 97 %   Constitutional: Well-developed, well-nourished female in no acute distress.  Cardiovascular: normal rate Respiratory: normal effort GI: Abd soft, non-tender. Pos BS x 4 MS: Extremities nontender, no edema, normal ROM Neurologic: Alert and oriented x 4.  GU: Neg CVAT.  PELVIC EXAM: Deferred   LAB RESULTS No results found for this or any previous visit (from the past 24  hour(s)).  --/--/O POS (07/16 0246)  IMAGING US OB Transvaginal  Result Date: 05/06/2020 CLINICAL DATA:  24 year old pregnant female presents for follow-up of gestational viability. Vaginal bleeding. Quantitative beta HCG obtained 3 weeks prior at 2,586. EDC by LMP: 11/15/2020, projecting to an expected gestational age of [redacted] weeks 3 days. EXAM: TRANSVAGINAL OB ULTRASOUND TECHNIQUE: Transvaginal ultrasound was performed for complete evaluation of the gestation as well as the maternal uterus, adnexal regions, and pelvic cul-de-sac. COMPARISON:  04/11/2020 obstetric scan. FINDINGS: Intrauterine gestational sac: Single irregular gestational sac in the lower uterine cavity Yolk sac:  Not Visualized. Embryo:  Not Visualized. MSD: 14.2 mm   6 w   2 d Subchorionic hemorrhage:  Small perigestational bleed. Maternal uterus/adnexae: Anteverted uterus with no uterine fibroids. Right ovary measures 3.1 x 1.8 x 2.5 cm. Left ovary measures 4.0 x 1.7 x 1.5 cm. No abnormal ovarian or adnexal masses. No abnormal free fluid in the pelvis. IMPRESSION: Single irregular intrauterine gestational sac in the lower uterine cavity with absence of previously visualized yolk sac and absence of embryo. Small perigestational bleed. Findings are compatible with spontaneous abortion in progress. No ovarian or adnexal abnormality. Electronically Signed   By: Delbert Phenix M.D.   On: 05/06/2020 16:59   US OB Transvaginal  Result Date: 04/11/2020 CLINICAL DATA:  Positive pregnancy test with spotting and pelvic pain EXAM: TRANSVAGINAL OB ULTRASOUND TECHNIQUE: Transvaginal ultrasound was performed for complete evaluation of the gestation as well as the maternal uterus, adnexal regions, and pelvic cul-de-sac. COMPARISON:  03/30/2020, 04/03/2020 FINDINGS: Intrauterine gestational sac: Present Yolk sac:  Present Embryo:  Absent MSD: 5.1 mm   5 w   2 d Subchorionic hemorrhage:  None visualized. Maternal uterus/adnexae: Ovaries are well visualized  and within normal limits. IMPRESSION: Probable early intrauterine gestational sac, but no fetal pole, or cardiac activity yet visualized. Recommend follow-up quantitative B-HCG levels and follow-up US in 14 days to assess viability. This recommendation follows SRU consensus guidelines: Diagnostic Criteria for Nonviable Pregnancy Early in the First Trimester. Malva Limes Med 2013; 778:2423-53. Electronically Signed   By: Alcide Clever M.D.   On: 04/11/2020 01:51    MAU Management/MDM: Orders Placed This Encounter  Procedures  . US OB Transvaginal  . Discharge patient    Meds ordered this encounter  Medications  . misoprostol (CYTOTEC) 200 MCG tablet    Sig: Place all 3 tablets into your cheeks at one time and let them dissolve into a paste before swallowing them.    Dispense:  3 tablet    Refill:  0    Order Specific Question:   Supervising Provider    Answer:   CONSTANT, PEGGY [  [id number]]    [place] today consistent with SAB in progress.  Discussed options with pt including expectant management, Cytotec, and D&C.  Pt prefers Cytotec since she has been bleeding off and on x several weeks already and wants the bleeding to stop.  Follow up at [facility] in 1 week for hcg, and 2 weeks for provider visit.  Bleeding precautions/reasons to return to MAU reviewed.  Rx for Cytotec 600 mcg buccal x 1 dose. Pt to take Tylenol and ibuprofen for pain.  Pt has Rx for Phenergan for nausea PRN.  RN to give comfort packet today.    ASSESSMENT 1. SAB (spontaneous abortion)     PLAN Discharge home Allergies as of 05/06/2020   No Known Allergies     Medication List    TAKE these medications   misoprostol 200 MCG tablet Commonly known as: Cytotec Place all 3 tablets into your cheeks at one time and let them dissolve into a paste before swallowing them.   promethazine 25 MG tablet Commonly known as: PHENERGAN Take 1 tablet (25 mg total) by mouth every 6 (six) hours as needed for nausea or vomiting.        Follow-up Information    [facility] Follow up.   Why: Follow up for lab visit in 1 week and provider visit in 2 weeks. The office will call you to set up appointments.  Contact information: [address] [place] [address] [phone]              Sharen Counter Certified Nurse-Midwife 05/06/2020  6:40 PM

## 2020-05-06 NOTE — MAU Note (Signed)
Pt reports to mau with c/o heavy vag bleeding for the past few days.  Pt reports she has been changing her pad every 2-3 hours but the pads are not fully saturated. Pt reports passing some small clots.  Pt denies any pain at this time.

## 2020-05-14 ENCOUNTER — Other Ambulatory Visit: Payer: Medicaid Other

## 2020-05-23 ENCOUNTER — Ambulatory Visit: Payer: Medicaid Other | Admitting: Advanced Practice Midwife

## 2020-08-06 IMAGING — US US OB TRANSVAGINAL
1 series · 14 of 28 positions shown · non-contrast
Comparison: 03/30/2020

CLINICAL DATA: Pelvic pain with bleeding

EXAM:
TRANSVAGINAL OB ULTRASOUND
TECHNIQUE: Transvaginal ultrasound was performed for complete evaluation of the
gestation as well as the maternal uterus, adnexal regions, and
pelvic cul-de-sac.

[Series 1: us ob transvaginal · 14 of 64 slices shown]
[im 3/64]
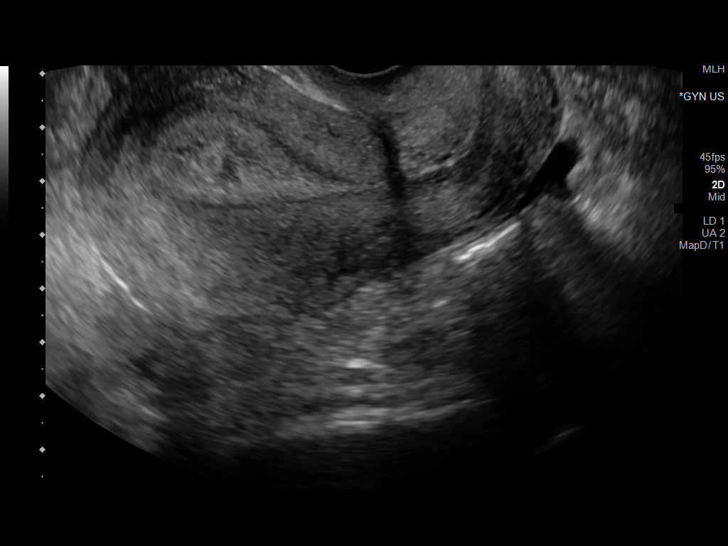
[im 8/64]
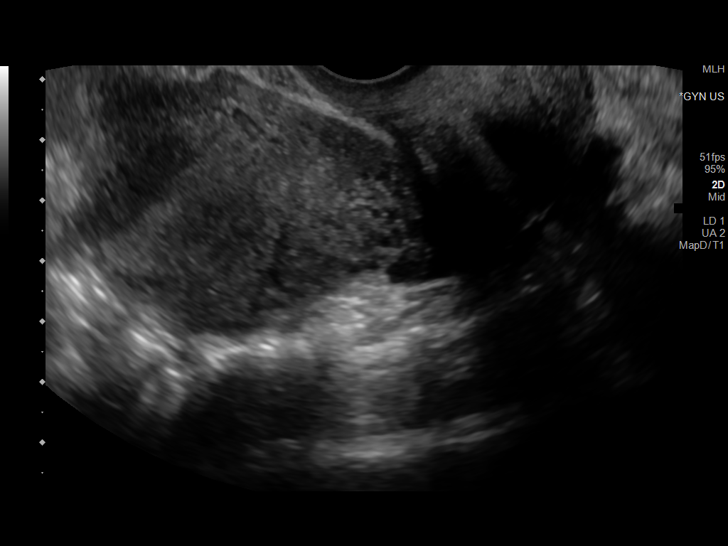
[im 12/64]
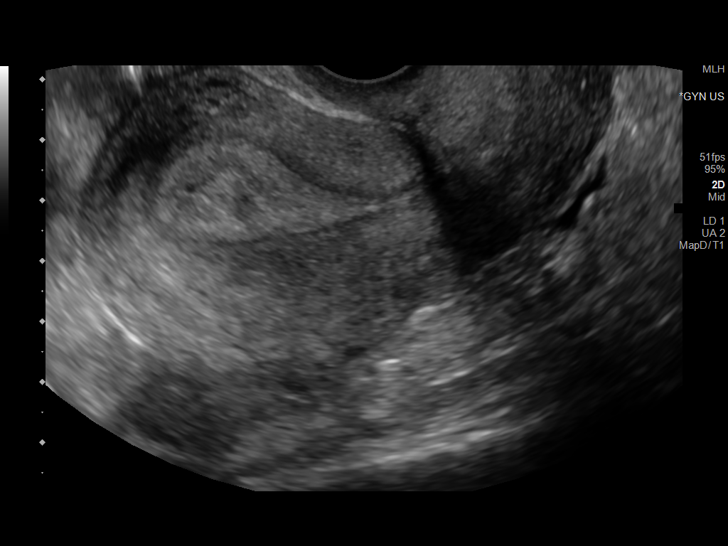
[im 17/64]
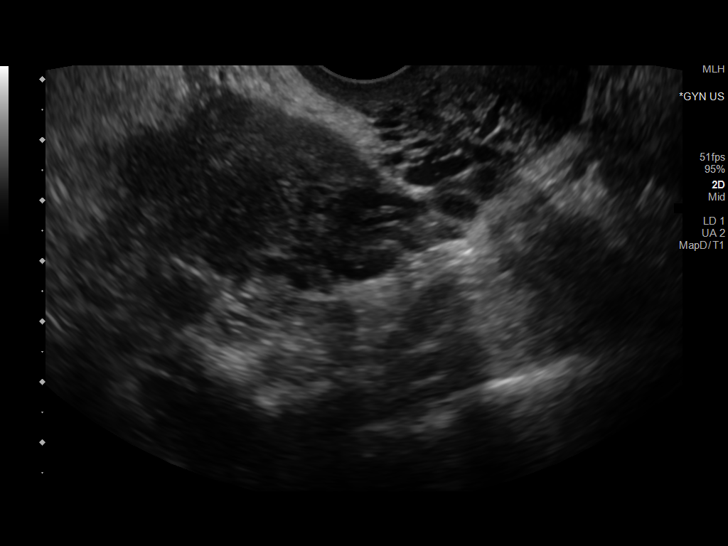
[im 22/64]
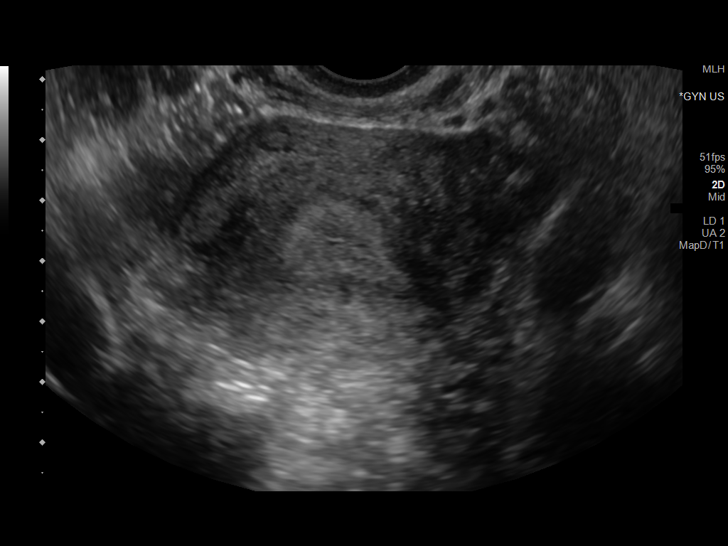
[im 26/64]
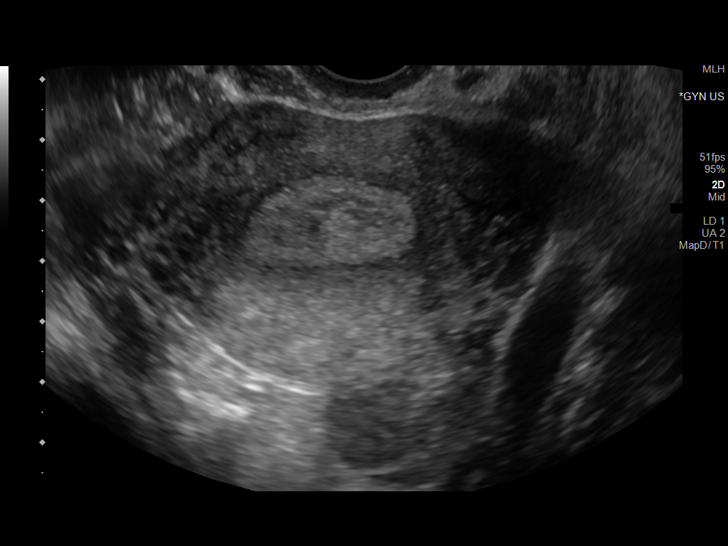
[im 31/64]
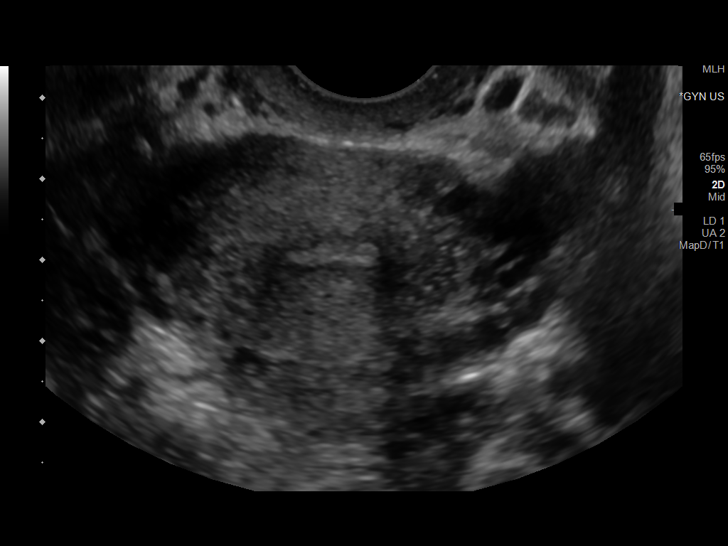
[im 36/64]
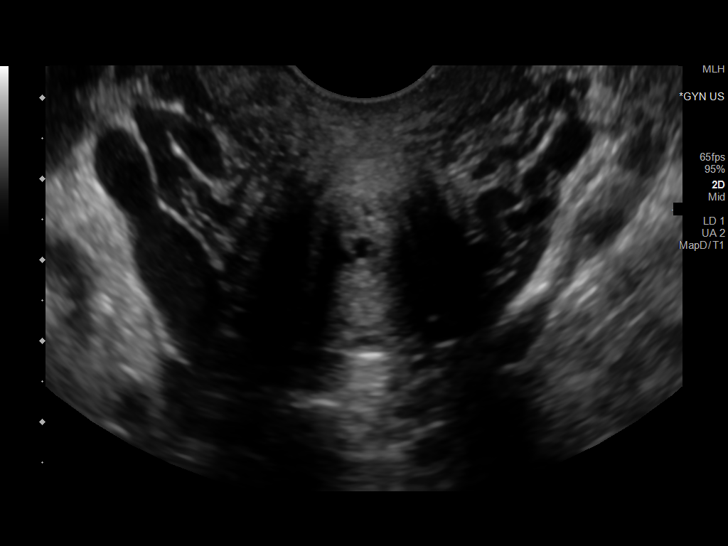
[im 40/64]
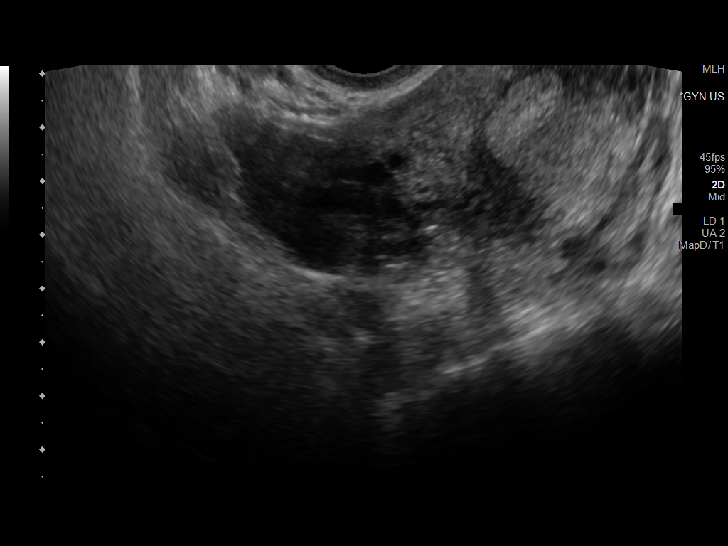
[im 45/64]
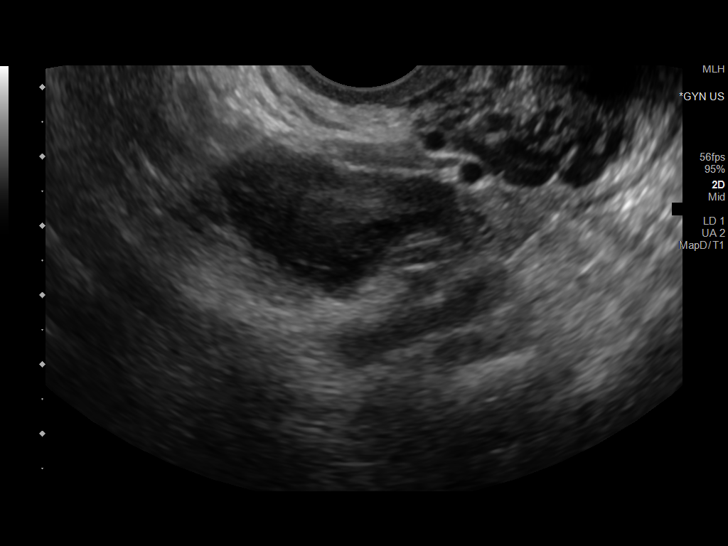
[im 50/64]
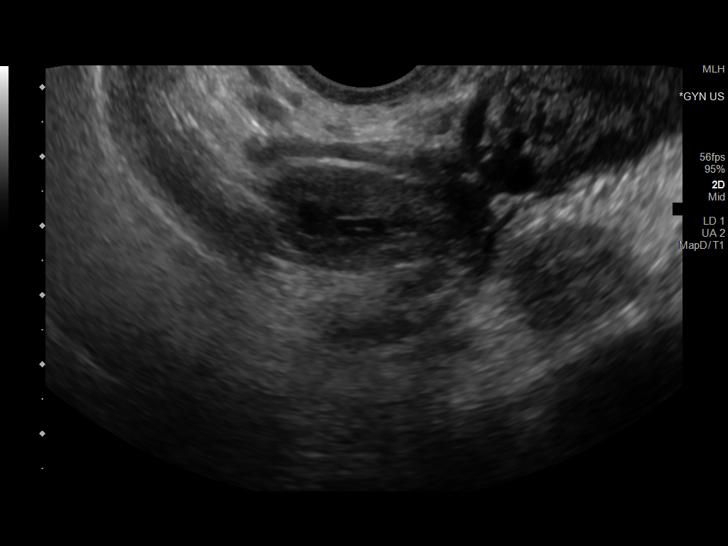
[im 54/64]
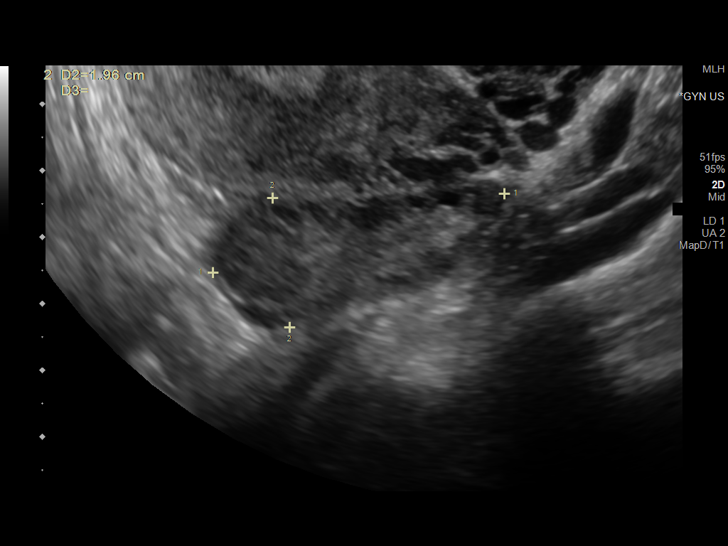
[im 59/64]
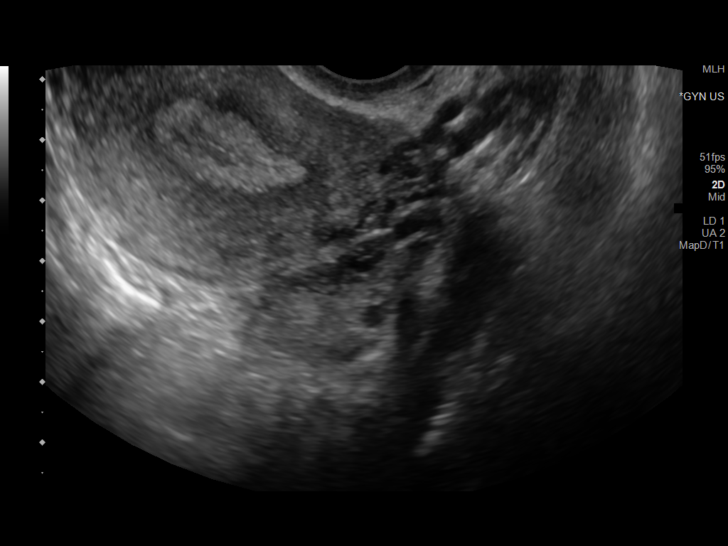
[im 64/64]
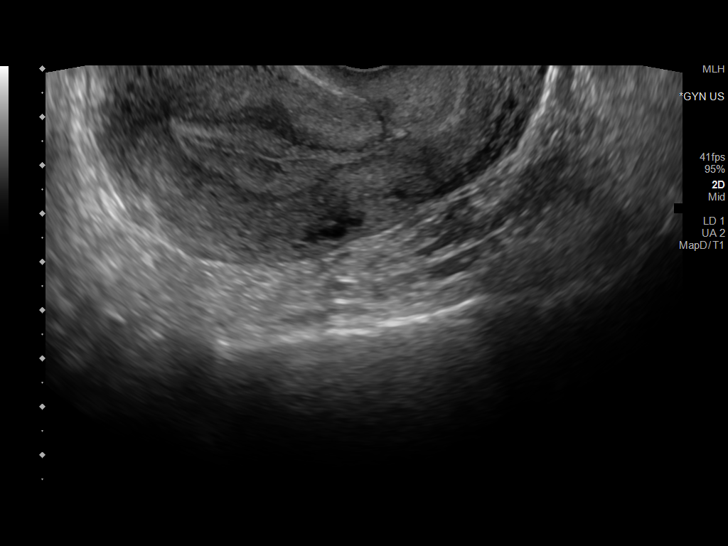

[14 of 28 positions shown; findings below may reference images not displayed]

FINDINGS: Intrauterine gestational sac: None

Yolk sac:  Not Visualized.

Embryo:  Not Visualized.

Maternal uterus/adnexae: Ovaries are within normal limits. The left
ovary measures 4.5 x 2 x 3.2 cm. The right ovary measures 3 x 2 x
3.2 cm. There is trace free fluid in the pelvis.
IMPRESSION: No IUP identified. Findings remain consistent with pregnancy of
unknown location, differential of which includes IUP too early to
visualize, recent failed pregnancy, and occult ectopic. Trending of
HCG with potential repeat ultrasound is recommended.

## 2020-08-14 IMAGING — US US OB TRANSVAGINAL
1 series · 15 of 26 positions shown · non-contrast
Comparison: 03/30/2020, 04/03/2020

CLINICAL DATA: Positive pregnancy test with spotting and pelvic
pain

EXAM:
TRANSVAGINAL OB ULTRASOUND
TECHNIQUE: Transvaginal ultrasound was performed for complete evaluation of the
gestation as well as the maternal uterus, adnexal regions, and
pelvic cul-de-sac.

[Series 1: us ob transvaginal · 15 of 26 slices shown]
[im 1/26]
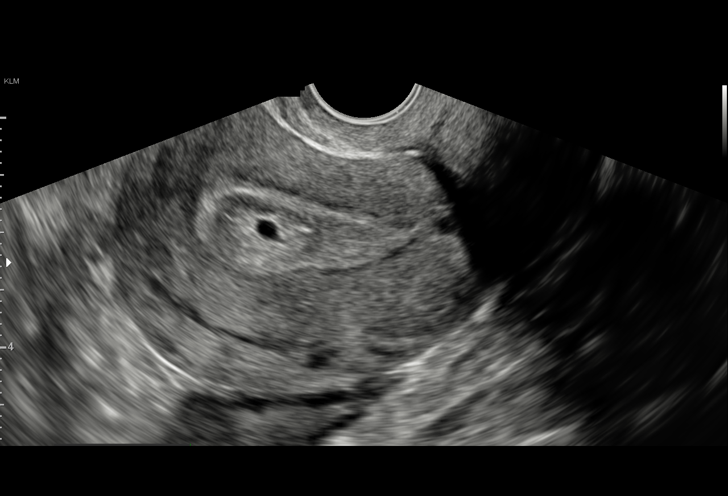
[im 3/26]
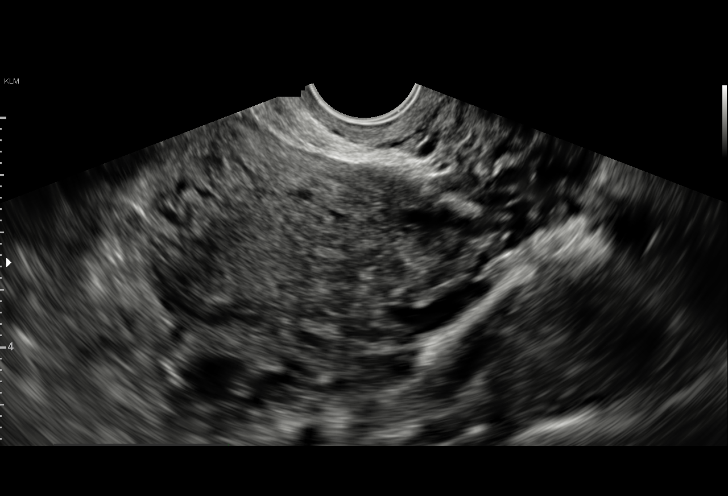
[im 5/26]
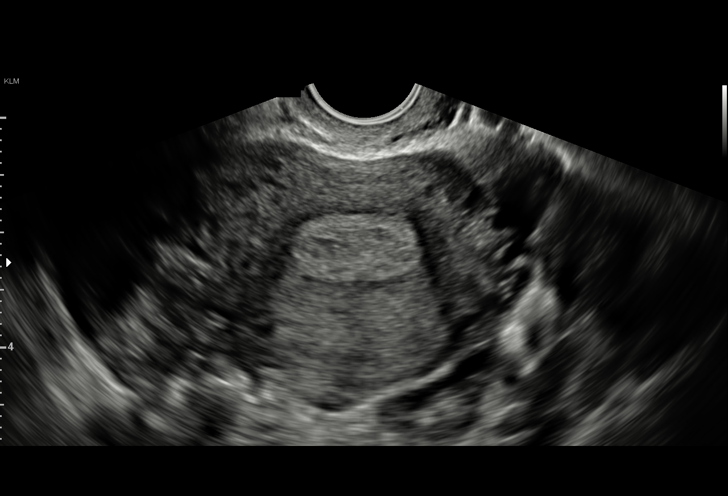
[im 7/26]
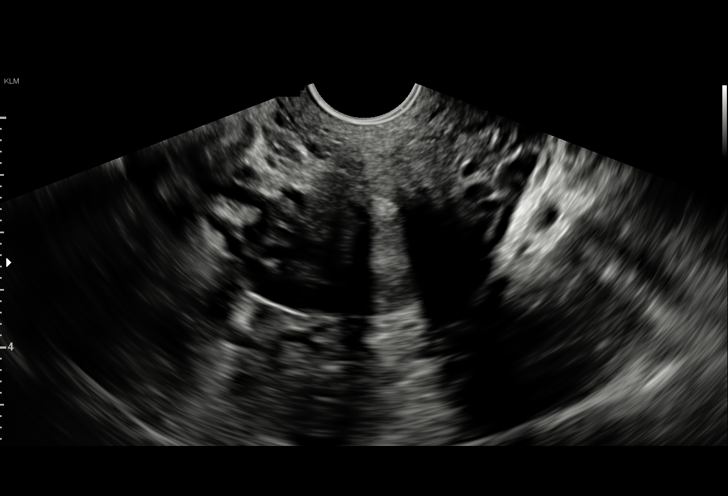
[im 8/26]
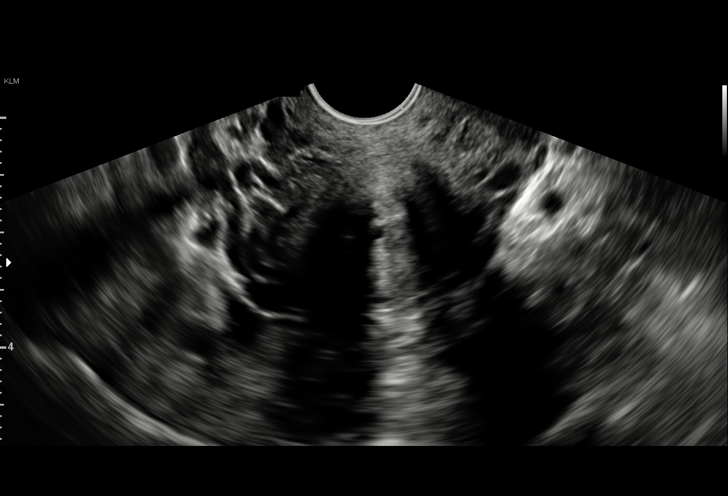
[im 10/26]
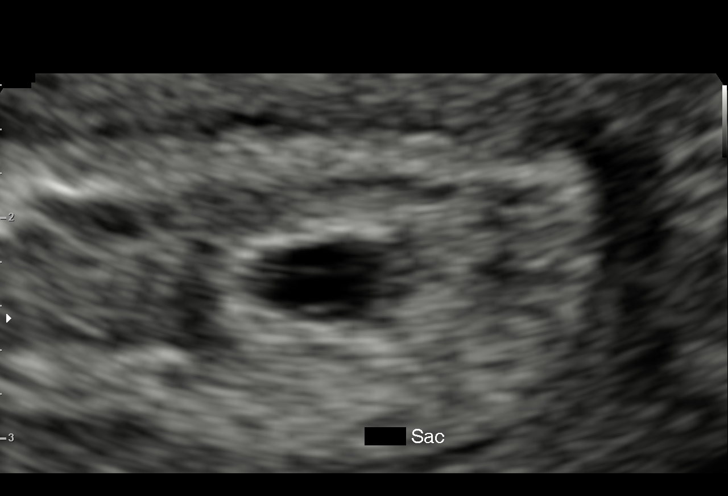
[im 12/26]
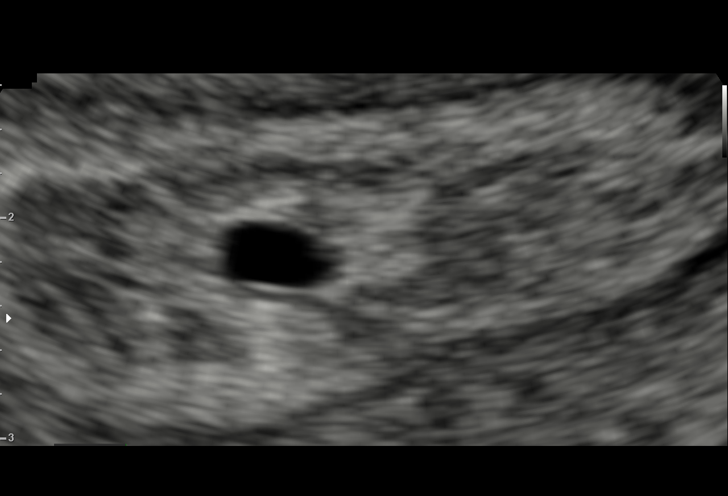
[im 14/26]
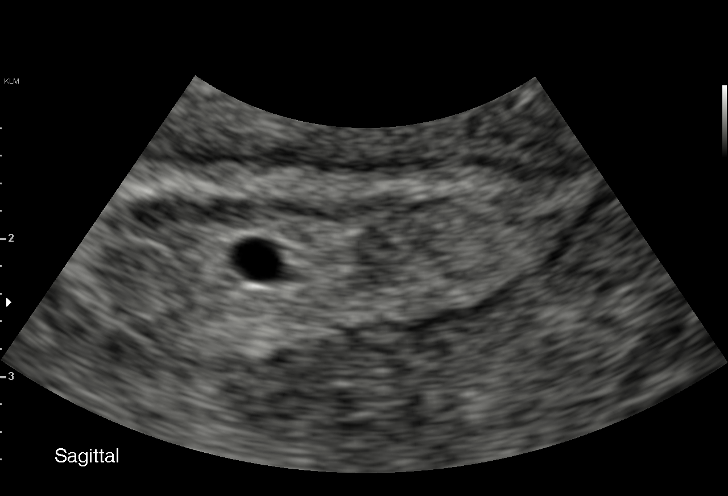
[im 15/26]
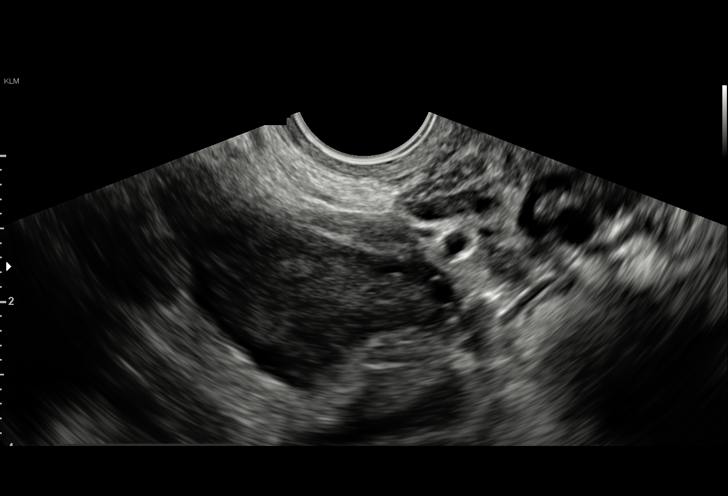
[im 17/26]
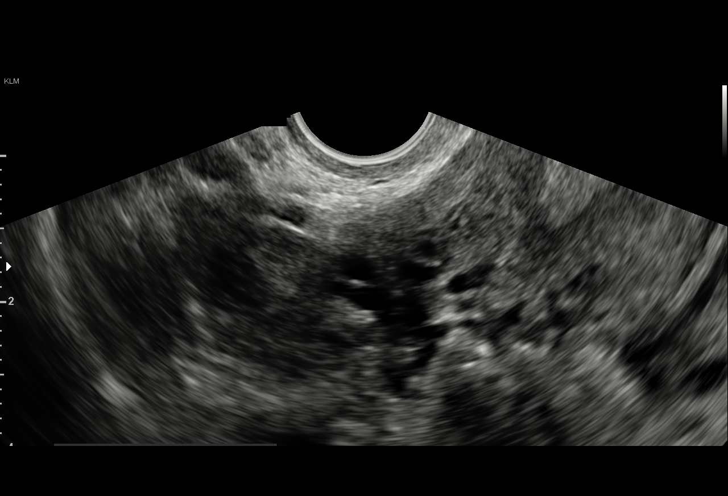
[im 19/26]
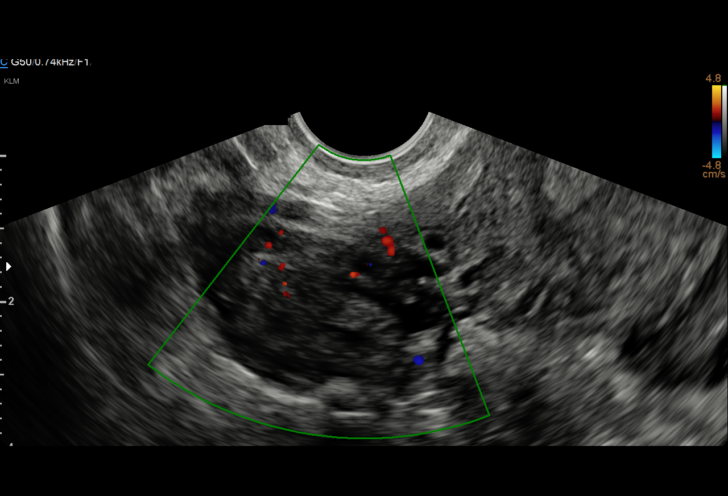
[im 20/26]
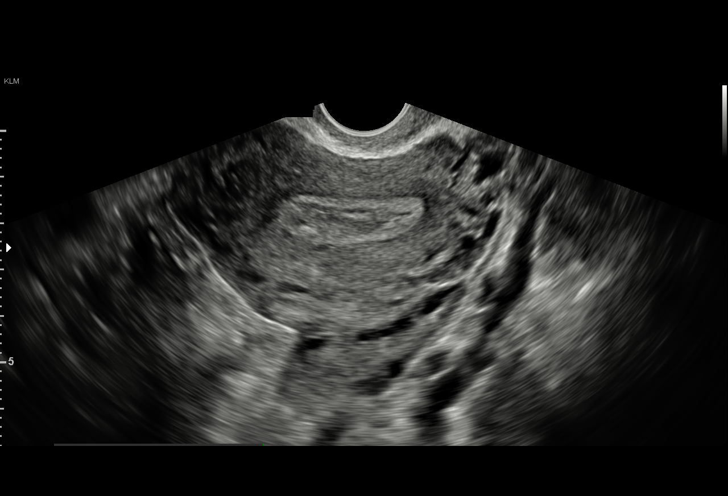
[im 22/26]
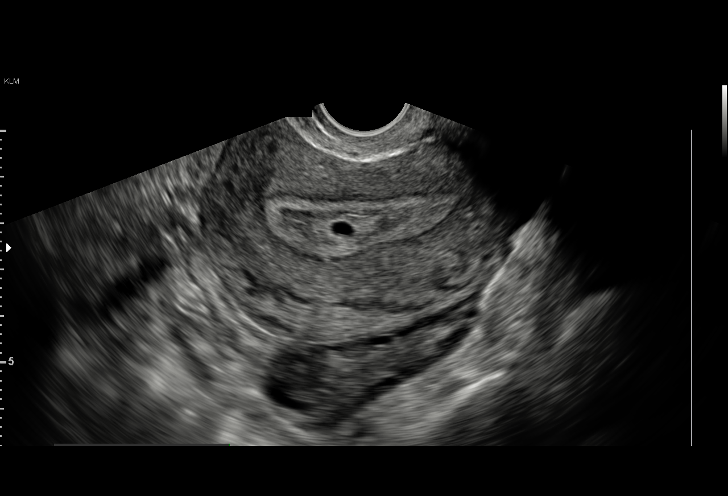
[im 24/26]
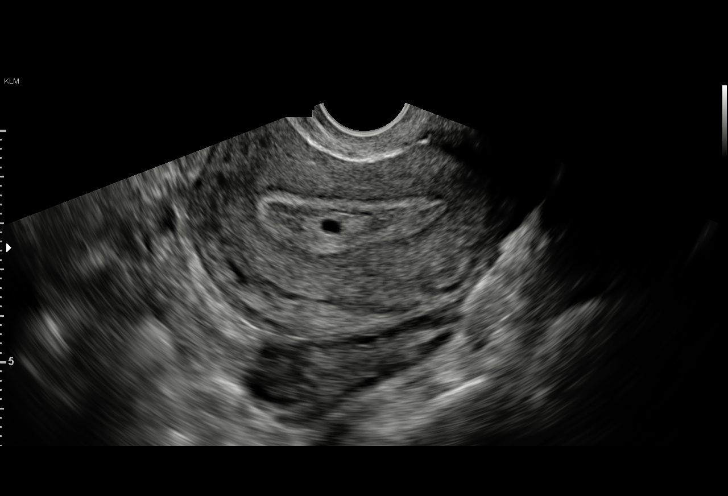
[im 26/26]
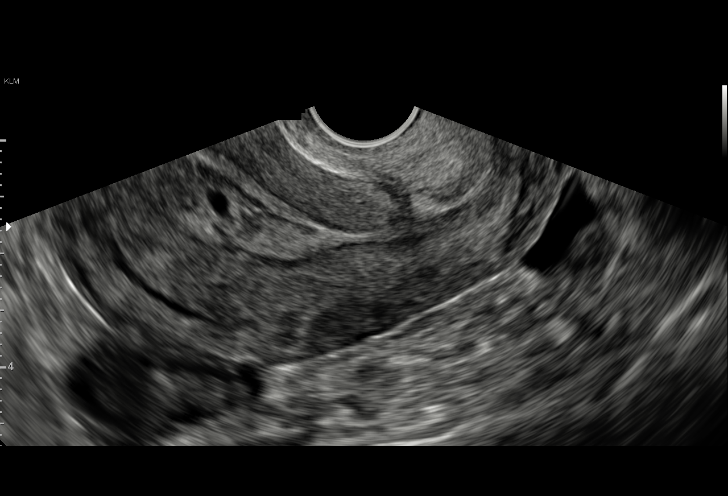

[15 of 26 positions shown; findings below may reference images not displayed]

FINDINGS: Intrauterine gestational sac: Present

Yolk sac:  Present

Embryo:  Absent

MSD: 5.1 mm   5 w   2 d

Subchorionic hemorrhage:  None visualized.

Maternal uterus/adnexae: Ovaries are well visualized and within
normal limits.
IMPRESSION: Probable early intrauterine gestational sac, but no fetal pole, or
cardiac activity yet visualized. Recommend follow-up quantitative
B-HCG levels and follow-up US in 14 days to assess viability. This
recommendation follows SRU consensus guidelines: Diagnostic Criteria
for Nonviable Pregnancy Early in the First Trimester. N Engl J Med

## 2020-09-08 IMAGING — US US OB TRANSVAGINAL
1 series · 15 of 28 positions shown · non-contrast
Comparison: 04/11/2020 obstetric scan.

CLINICAL DATA: 23-year-old pregnant female presents for follow-up
of gestational viability. Vaginal bleeding. Quantitative beta HCG
obtained 3 weeks prior at [DATE].

EDC by LMP: 11/15/2020, projecting to an expected gestational age of
8 weeks 3 days.
EXAM:
TRANSVAGINAL OB ULTRASOUND
TECHNIQUE: Transvaginal ultrasound was performed for complete evaluation of the
gestation as well as the maternal uterus, adnexal regions, and
pelvic cul-de-sac.

[Series 1: us ob transvaginal · 54 acquisitions, 15 frames shown]
[im 1/54]
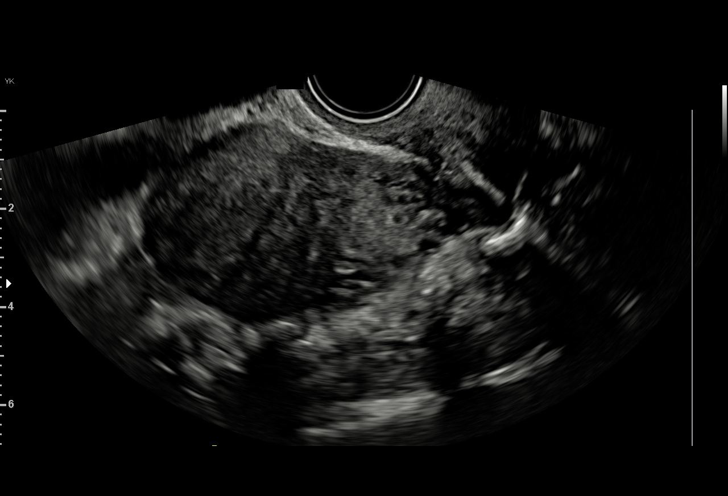
[im 4/54]
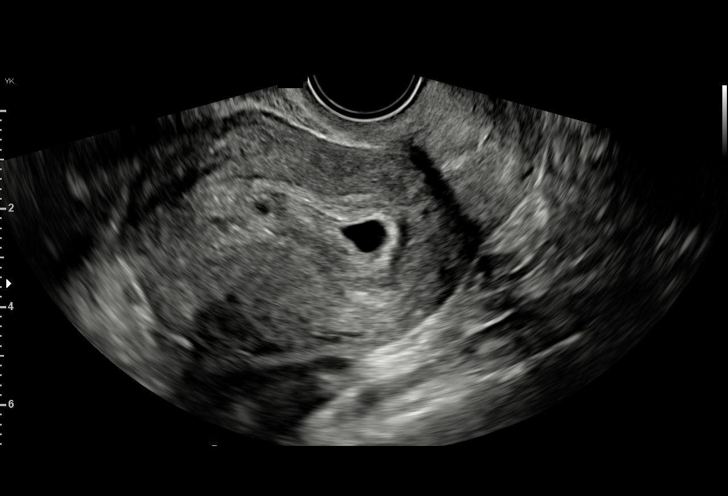
[im 8/54]
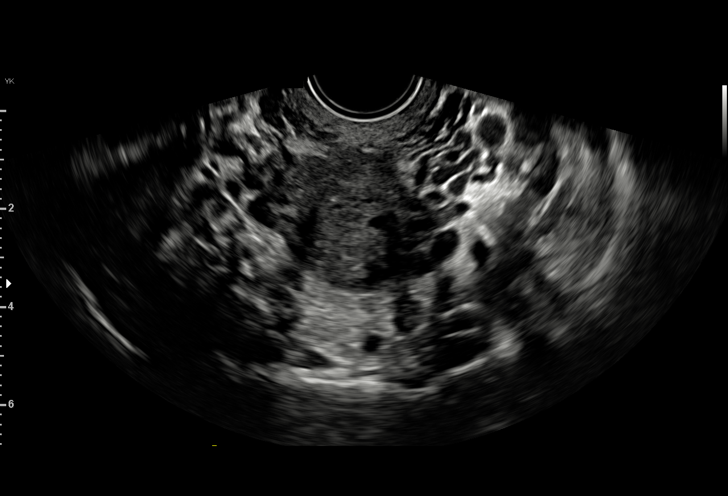
[im 12/54]
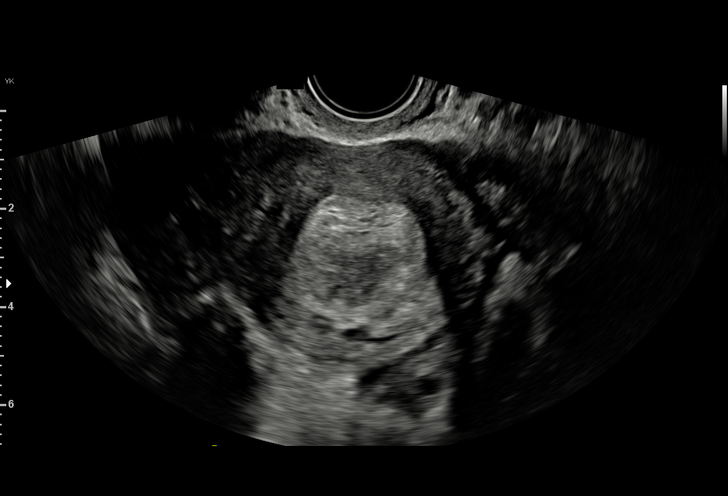
[im 16/54]
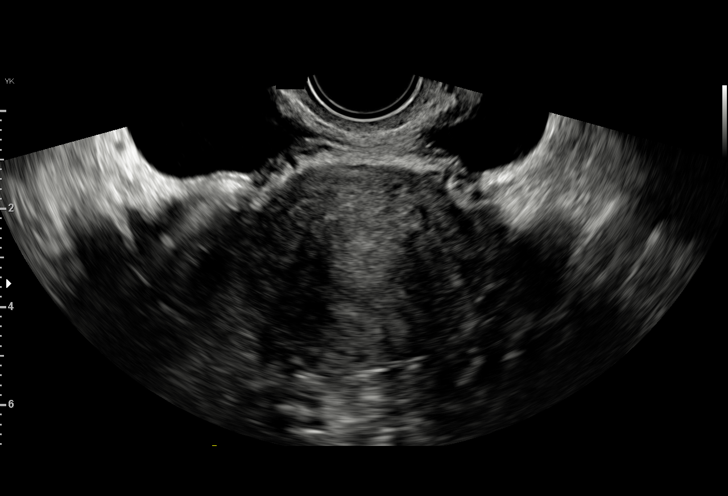
[im 20/54]
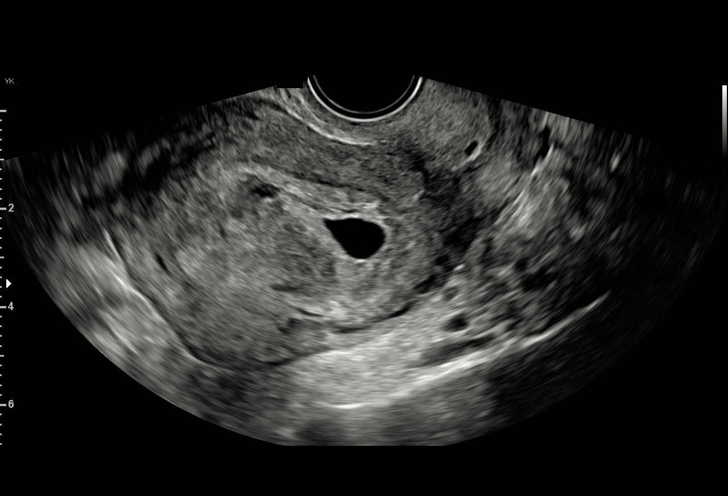
[im 24/54]
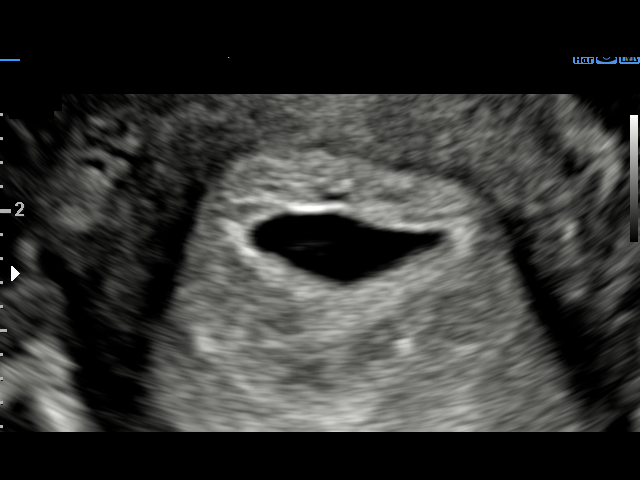
[im 28/54]
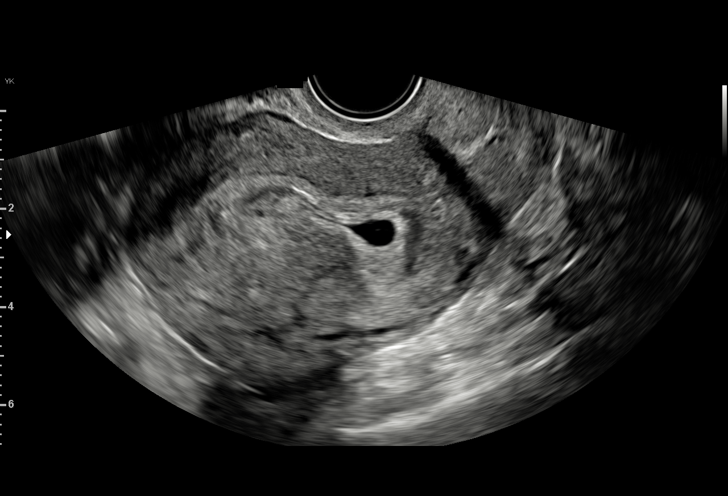
[im 30/54]
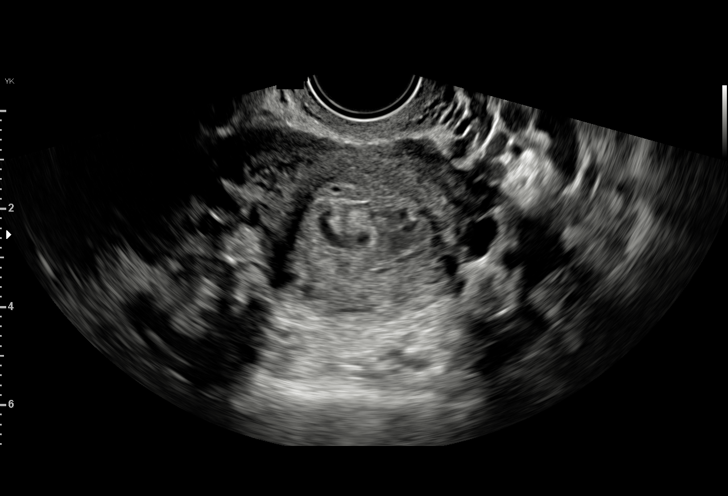
[im 34/54]
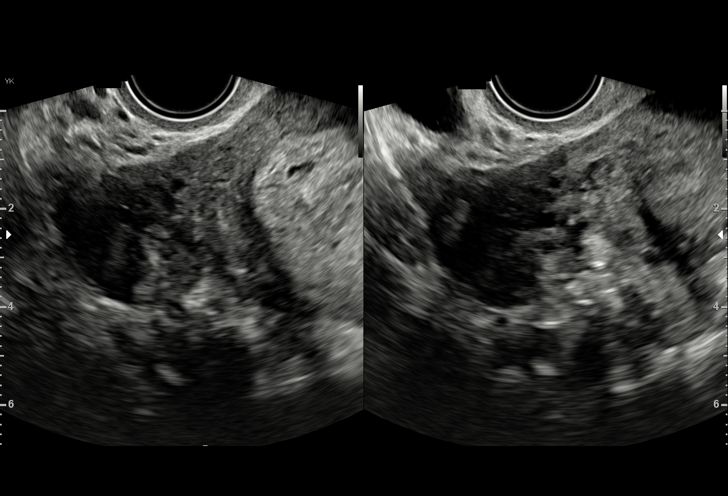
[im 38/54]
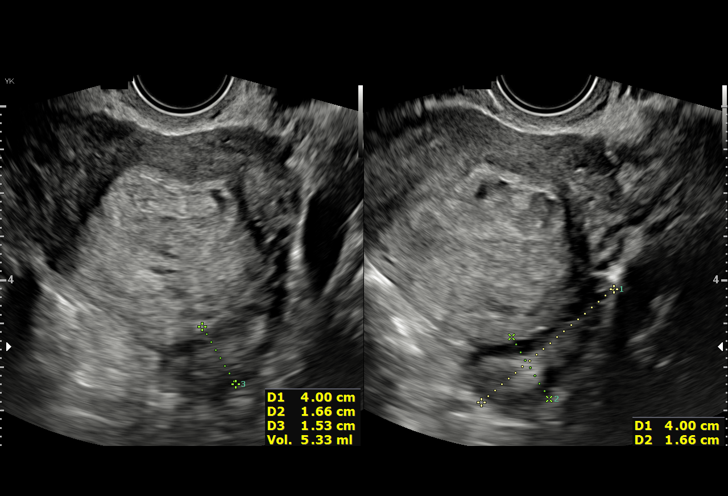
[im 42/54]
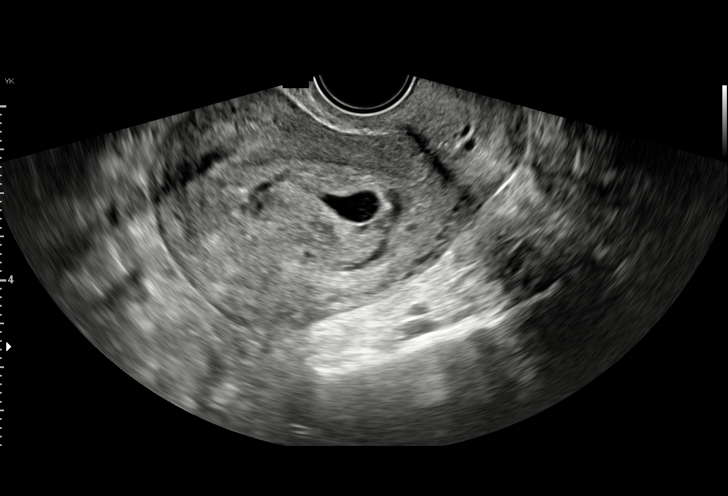
[im 46/54]
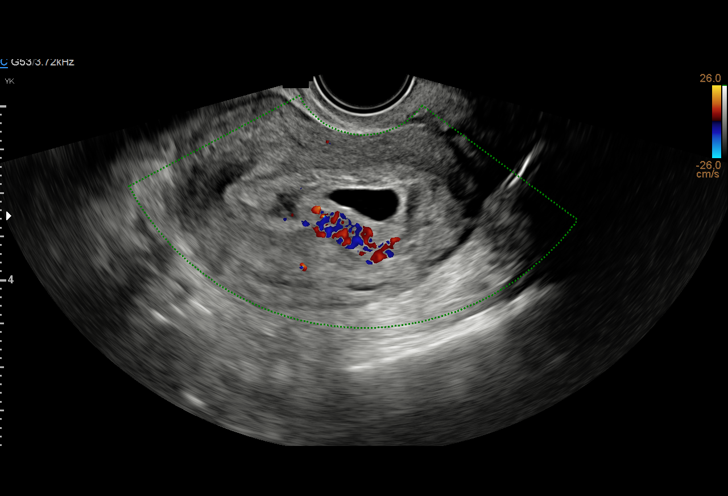
[im 50/54]
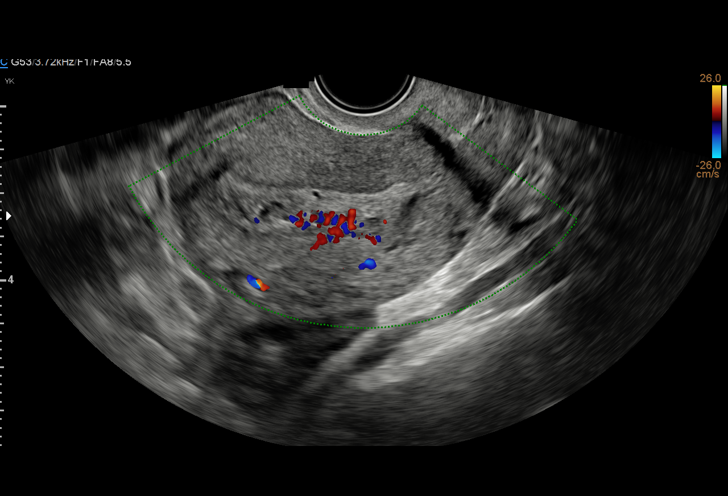
[im 54/54]
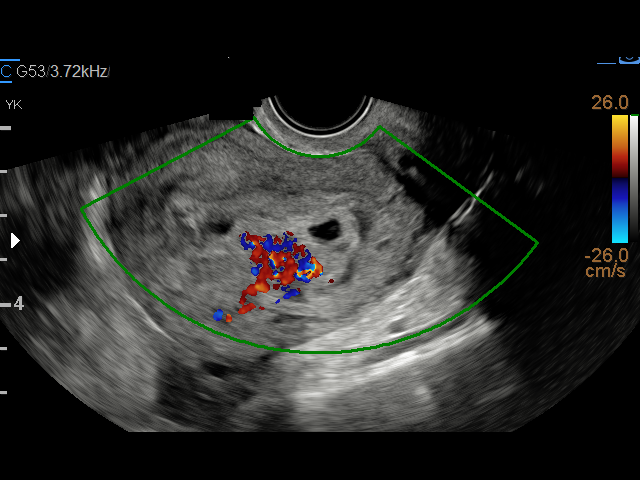

[15 of 28 positions shown; findings below may reference images not displayed]

FINDINGS: Intrauterine gestational sac: Single irregular gestational sac in
the lower uterine cavity

Yolk sac:  Not Visualized.

Embryo:  Not Visualized.

MSD: 14.2 mm   6 w   2 d

Subchorionic hemorrhage:  Small perigestational bleed.

Maternal uterus/adnexae: Anteverted uterus with no uterine fibroids.
Right ovary measures 3.1 x 1.8 x 2.5 cm. Left ovary measures 4.0 x
1.7 x 1.5 cm. No abnormal ovarian or adnexal masses. No abnormal
free fluid in the pelvis.
IMPRESSION: Single irregular intrauterine gestational sac in the lower uterine
cavity with absence of previously visualized yolk sac and absence of
embryo. Small perigestational bleed. Findings are compatible with
spontaneous abortion in progress.

No ovarian or adnexal abnormality.
